# Patient Record
Sex: Female | Born: 1935 | Race: White | Hispanic: No | Marital: Single | Smoking: Never smoker
Health system: Southern US, Community
[De-identification: ages and names within clinical notes are randomized; demographics above are authoritative.]

## PROBLEM LIST (undated history)

## (undated) DIAGNOSIS — G309 Alzheimer's disease, unspecified: Secondary | ICD-10-CM

## (undated) DIAGNOSIS — E119 Type 2 diabetes mellitus without complications: Secondary | ICD-10-CM

## (undated) DIAGNOSIS — I10 Essential (primary) hypertension: Secondary | ICD-10-CM

## (undated) DIAGNOSIS — E039 Hypothyroidism, unspecified: Secondary | ICD-10-CM

## (undated) DIAGNOSIS — I4891 Unspecified atrial fibrillation: Secondary | ICD-10-CM

## (undated) DIAGNOSIS — F0280 Dementia in other diseases classified elsewhere without behavioral disturbance: Secondary | ICD-10-CM

## (undated) HISTORY — PX: ABDOMINAL HYSTERECTOMY: SHX81

---

## 2008-07-31 ENCOUNTER — Encounter: Admission: RE | Admit: 2008-07-31 | Discharge: 2008-07-31 | Payer: Self-pay | Admitting: Geriatric Medicine

## 2009-12-23 ENCOUNTER — Encounter: Admission: RE | Admit: 2009-12-23 | Discharge: 2009-12-23 | Payer: Self-pay | Admitting: Geriatric Medicine

## 2010-05-27 ENCOUNTER — Encounter
Admission: RE | Admit: 2010-05-27 | Discharge: 2010-05-27 | Payer: Self-pay | Source: Home / Self Care | Attending: Geriatric Medicine | Admitting: Geriatric Medicine

## 2010-08-25 ENCOUNTER — Other Ambulatory Visit: Payer: Self-pay | Admitting: Geriatric Medicine

## 2010-08-25 DIAGNOSIS — R748 Abnormal levels of other serum enzymes: Secondary | ICD-10-CM

## 2010-08-27 ENCOUNTER — Other Ambulatory Visit: Payer: Self-pay

## 2010-08-28 ENCOUNTER — Ambulatory Visit
Admission: RE | Admit: 2010-08-28 | Discharge: 2010-08-28 | Disposition: A | Discharge status: Home / Self Care | Payer: Medicare Other | Source: Ambulatory Visit | Attending: Geriatric Medicine | Admitting: Geriatric Medicine

## 2010-08-28 DIAGNOSIS — R748 Abnormal levels of other serum enzymes: Secondary | ICD-10-CM

## 2011-06-02 ENCOUNTER — Other Ambulatory Visit: Payer: Self-pay | Admitting: Geriatric Medicine

## 2011-06-02 DIAGNOSIS — R109 Unspecified abdominal pain: Secondary | ICD-10-CM

## 2011-06-04 ENCOUNTER — Ambulatory Visit
Admission: RE | Admit: 2011-06-04 | Discharge: 2011-06-04 | Disposition: A | Discharge status: Home / Self Care | Payer: Medicare Other | Source: Ambulatory Visit | Attending: Geriatric Medicine | Admitting: Geriatric Medicine

## 2011-06-04 DIAGNOSIS — R109 Unspecified abdominal pain: Secondary | ICD-10-CM

## 2011-06-04 MED ORDER — IOHEXOL 300 MG/ML  SOLN
100.0000 mL | Freq: Once | INTRAMUSCULAR | Status: AC | PRN
  Administered 2011-06-04: 100 mL via INTRAVENOUS

## 2012-11-02 ENCOUNTER — Other Ambulatory Visit: Payer: Self-pay | Admitting: Geriatric Medicine

## 2012-11-02 DIAGNOSIS — R2681 Unsteadiness on feet: Secondary | ICD-10-CM

## 2012-11-08 ENCOUNTER — Ambulatory Visit
Admission: RE | Admit: 2012-11-08 | Discharge: 2012-11-08 | Disposition: A | Discharge status: Home / Self Care | Payer: Medicare Other | Source: Ambulatory Visit | Attending: Geriatric Medicine | Admitting: Geriatric Medicine

## 2012-11-08 DIAGNOSIS — R2681 Unsteadiness on feet: Secondary | ICD-10-CM

## 2014-11-25 DIAGNOSIS — I4891 Unspecified atrial fibrillation: Secondary | ICD-10-CM | POA: Insufficient documentation

## 2014-11-27 ENCOUNTER — Other Ambulatory Visit: Payer: Self-pay

## 2014-11-27 ENCOUNTER — Ambulatory Visit (INDEPENDENT_AMBULATORY_CARE_PROVIDER_SITE_OTHER): Payer: Medicare Other

## 2014-11-27 ENCOUNTER — Other Ambulatory Visit (HOSPITAL_COMMUNITY): Payer: Self-pay | Admitting: Geriatric Medicine

## 2014-11-27 ENCOUNTER — Ambulatory Visit (HOSPITAL_COMMUNITY): Payer: Medicare Other

## 2014-11-27 ENCOUNTER — Ambulatory Visit (HOSPITAL_COMMUNITY): Payer: Medicare Other | Attending: Cardiology

## 2014-11-27 DIAGNOSIS — E785 Hyperlipidemia, unspecified: Secondary | ICD-10-CM | POA: Insufficient documentation

## 2014-11-27 DIAGNOSIS — E119 Type 2 diabetes mellitus without complications: Secondary | ICD-10-CM | POA: Diagnosis not present

## 2014-11-27 DIAGNOSIS — I4891 Unspecified atrial fibrillation: Secondary | ICD-10-CM | POA: Diagnosis not present

## 2014-11-27 DIAGNOSIS — I700 Atherosclerosis of aorta: Secondary | ICD-10-CM | POA: Insufficient documentation

## 2014-11-27 DIAGNOSIS — I5189 Other ill-defined heart diseases: Secondary | ICD-10-CM | POA: Insufficient documentation

## 2014-11-27 DIAGNOSIS — I10 Essential (primary) hypertension: Secondary | ICD-10-CM | POA: Diagnosis not present

## 2014-11-27 DIAGNOSIS — I340 Nonrheumatic mitral (valve) insufficiency: Secondary | ICD-10-CM | POA: Diagnosis not present

## 2016-03-05 ENCOUNTER — Inpatient Hospital Stay (HOSPITAL_COMMUNITY)
Admission: EM | Admit: 2016-03-05 | Discharge: 2016-03-12 | DRG: 392 | Disposition: A | Discharge status: Skilled Nursing Facility | Payer: Medicare Other | Attending: Internal Medicine | Admitting: Internal Medicine

## 2016-03-05 ENCOUNTER — Encounter (HOSPITAL_COMMUNITY): Payer: Self-pay

## 2016-03-05 DIAGNOSIS — E876 Hypokalemia: Secondary | ICD-10-CM | POA: Diagnosis not present

## 2016-03-05 DIAGNOSIS — D72829 Elevated white blood cell count, unspecified: Secondary | ICD-10-CM | POA: Diagnosis present

## 2016-03-05 DIAGNOSIS — K5792 Diverticulitis of intestine, part unspecified, without perforation or abscess without bleeding: Secondary | ICD-10-CM | POA: Diagnosis present

## 2016-03-05 DIAGNOSIS — I4891 Unspecified atrial fibrillation: Secondary | ICD-10-CM | POA: Diagnosis present

## 2016-03-05 DIAGNOSIS — E1159 Type 2 diabetes mellitus with other circulatory complications: Secondary | ICD-10-CM | POA: Diagnosis present

## 2016-03-05 DIAGNOSIS — H9190 Unspecified hearing loss, unspecified ear: Secondary | ICD-10-CM | POA: Diagnosis present

## 2016-03-05 DIAGNOSIS — Z7901 Long term (current) use of anticoagulants: Secondary | ICD-10-CM

## 2016-03-05 DIAGNOSIS — E872 Acidosis: Secondary | ICD-10-CM | POA: Diagnosis present

## 2016-03-05 DIAGNOSIS — Z888 Allergy status to other drugs, medicaments and biological substances status: Secondary | ICD-10-CM

## 2016-03-05 DIAGNOSIS — I480 Paroxysmal atrial fibrillation: Secondary | ICD-10-CM | POA: Diagnosis present

## 2016-03-05 DIAGNOSIS — E039 Hypothyroidism, unspecified: Secondary | ICD-10-CM | POA: Diagnosis present

## 2016-03-05 DIAGNOSIS — Z8249 Family history of ischemic heart disease and other diseases of the circulatory system: Secondary | ICD-10-CM

## 2016-03-05 DIAGNOSIS — Z7984 Long term (current) use of oral hypoglycemic drugs: Secondary | ICD-10-CM

## 2016-03-05 DIAGNOSIS — Z90710 Acquired absence of both cervix and uterus: Secondary | ICD-10-CM

## 2016-03-05 DIAGNOSIS — K5700 Diverticulitis of small intestine with perforation and abscess without bleeding: Principal | ICD-10-CM | POA: Diagnosis present

## 2016-03-05 DIAGNOSIS — I10 Essential (primary) hypertension: Secondary | ICD-10-CM | POA: Diagnosis present

## 2016-03-05 DIAGNOSIS — I152 Hypertension secondary to endocrine disorders: Secondary | ICD-10-CM | POA: Diagnosis present

## 2016-03-05 DIAGNOSIS — Z7722 Contact with and (suspected) exposure to environmental tobacco smoke (acute) (chronic): Secondary | ICD-10-CM | POA: Diagnosis present

## 2016-03-05 DIAGNOSIS — E119 Type 2 diabetes mellitus without complications: Secondary | ICD-10-CM | POA: Diagnosis present

## 2016-03-05 DIAGNOSIS — G309 Alzheimer's disease, unspecified: Secondary | ICD-10-CM | POA: Diagnosis present

## 2016-03-05 DIAGNOSIS — K5720 Diverticulitis of large intestine with perforation and abscess without bleeding: Secondary | ICD-10-CM

## 2016-03-05 DIAGNOSIS — F0280 Dementia in other diseases classified elsewhere without behavioral disturbance: Secondary | ICD-10-CM | POA: Diagnosis present

## 2016-03-05 DIAGNOSIS — R509 Fever, unspecified: Secondary | ICD-10-CM | POA: Diagnosis not present

## 2016-03-05 DIAGNOSIS — Z79899 Other long term (current) drug therapy: Secondary | ICD-10-CM

## 2016-03-05 DIAGNOSIS — M6281 Muscle weakness (generalized): Secondary | ICD-10-CM

## 2016-03-05 HISTORY — DX: Dementia in other diseases classified elsewhere, unspecified severity, without behavioral disturbance, psychotic disturbance, mood disturbance, and anxiety: F02.80

## 2016-03-05 HISTORY — DX: Unspecified atrial fibrillation: I48.91

## 2016-03-05 HISTORY — DX: Essential (primary) hypertension: I10

## 2016-03-05 HISTORY — DX: Alzheimer's disease, unspecified: G30.9

## 2016-03-05 HISTORY — DX: Hypothyroidism, unspecified: E03.9

## 2016-03-05 HISTORY — DX: Type 2 diabetes mellitus without complications: E11.9

## 2016-03-05 LAB — COMPREHENSIVE METABOLIC PANEL
ALK PHOS: 38 U/L (ref 38–126)
ALT: 25 U/L (ref 14–54)
AST: 30 U/L (ref 15–41)
Albumin: 3.4 g/dL — ABNORMAL LOW (ref 3.5–5.0)
Anion gap: 11 (ref 5–15)
BILIRUBIN TOTAL: 0.6 mg/dL (ref 0.3–1.2)
BUN: 21 mg/dL — ABNORMAL HIGH (ref 6–20)
CALCIUM: 9.4 mg/dL (ref 8.9–10.3)
CO2: 20 mmol/L — AB (ref 22–32)
CREATININE: 1.1 mg/dL — AB (ref 0.44–1.00)
Chloride: 105 mmol/L (ref 101–111)
GFR, EST AFRICAN AMERICAN: 52 mL/min — AB (ref >60)
GFR, EST NON AFRICAN AMERICAN: 45 mL/min — AB (ref >60)
Glucose, Bld: 219 mg/dL — ABNORMAL HIGH (ref 65–99)
Potassium: 4.2 mmol/L (ref 3.5–5.1)
Sodium: 136 mmol/L (ref 135–145)
TOTAL PROTEIN: 7.9 g/dL (ref 6.5–8.1)

## 2016-03-05 LAB — CBC
HCT: 33.8 % — ABNORMAL LOW (ref 36.0–46.0)
Hemoglobin: 11.2 g/dL — ABNORMAL LOW (ref 12.0–15.0)
MCH: 30.9 pg (ref 26.0–34.0)
MCHC: 33.1 g/dL (ref 30.0–36.0)
MCV: 93.1 fL (ref 78.0–100.0)
PLATELETS: 227 10*3/uL (ref 150–400)
RBC: 3.63 MIL/uL — AB (ref 3.87–5.11)
RDW: 14.3 % (ref 11.5–15.5)
WBC: 13.8 10*3/uL — ABNORMAL HIGH (ref 4.0–10.5)

## 2016-03-05 LAB — PROTIME-INR
INR: 1.2
PROTHROMBIN TIME: 15.3 s — AB (ref 11.4–15.2)

## 2016-03-05 MED ORDER — ACETAMINOPHEN 500 MG PO TABS
1000.0000 mg | ORAL_TABLET | Freq: Once | ORAL | Status: AC
Start: 2016-03-05 — End: 2016-03-05
  Administered 2016-03-05: 1000 mg via ORAL
  Filled 2016-03-05: qty 2

## 2016-03-05 MED ORDER — SODIUM CHLORIDE 0.9 % IV BOLUS (SEPSIS)
1000.0000 mL | Freq: Once | INTRAVENOUS | Status: AC
  Administered 2016-03-06: 1000 mL via INTRAVENOUS

## 2016-03-05 NOTE — ED Triage Notes (Signed)
Pt sent in for evaluation of fever from NH pt has no fever upon arrival to hospital

## 2016-03-05 NOTE — ED Provider Notes (Signed)
[facility]-EMERGENCY DEPT Provider Note   CSN: [id number] Arrival date & time: 03/05/16  2139  By signing my name below, I, Heidi George, attest that this documentation has been prepared under the direction and in the presence of Heidi CrumbleAdeleke Marvine Encalade, MD. Electronically Signed: Rosario AdieWilliam Andrew George, ED Scribe. 03/05/16. 11:17 PM.  History   Chief Complaint Chief Complaint  Patient presents with  . Fever    pt sent in for fever eval from [facility]    LEVEL 5 CAVEAT: HPI and ROS limited due to alzheimer's disease  The history is provided by the patient, medical records, the nursing home and a relative. History limited by: Alzheimer's disease. No language interpreter was used.   HPI Comments: Heidi George is a 82 y.o. female BIB EMS from [facility], with a PMHx of Alzheimer's disease, who presents to the Emergency Department accompanied by family who are complaining of fever onset earlier today. Family reports that the facility called them today and informed them that approximately 4 hours PTA that she was acting mildly confused from her baseline and they noted that she had a fever. Family states that the pt is also more confused from her typical baseline. Per family, pt was acting towards her baseline prior to seeing her today. Family also notes that the pt did sustain a fall approximately one week ago where she fell onto her backside. Pt states that the left side of her back is still in sore-like pain due to this. Per family, facility did not give the pt any treatments prior to the pt being brought into the ED. Pt is on Eliquis therapy daily. Family denies any cough, rhinorrhea, diarrhea, or any other known symptoms.   Past Medical History:  Diagnosis Date  . Alzheimer's dementia   . Atrial fibrillation (HCC)   . Diabetes mellitus without complication (HCC)   . Hypertension   . Hypothyroidism    Patient Active Problem List   Diagnosis Date Noted  . Diverticulitis 03/06/2016  .  Atrial fibrillation with controlled ventricular response (HCC) 11/25/2014   History reviewed. No pertinent surgical history.  OB History    No data available     Home Medications    Prior to Admission medications   Medication Sig Start Date End Date Taking? Authorizing Provider  apixaban (ELIQUIS) 2.5 MG TABS tablet Take 2.5 mg by mouth 2 (two) times daily.   Yes Historical Provider, MD  cloNIDine (CATAPRES) 0.1 MG tablet Take 0.1 mg by mouth 2 (two) times daily.   Yes Historical Provider, MD  donepezil (ARICEPT) 10 MG tablet Take 10 mg by mouth at bedtime.   Yes Historical Provider, MD  ferrous sulfate 325 (65 FE) MG tablet Take 325 mg by mouth daily with breakfast.   Yes Historical Provider, MD  levothyroxine (SYNTHROID, LEVOTHROID) 150 MCG tablet Take 150 mcg by mouth daily before breakfast.   Yes Historical Provider, MD  losartan (COZAAR) 100 MG tablet Take 100 mg by mouth daily.   Yes Historical Provider, MD  memantine (NAMENDA XR) 28 MG CP24 24 hr capsule Take 28 mg by mouth daily.   Yes Historical Provider, MD  metFORMIN (GLUCOPHAGE-XR) 500 MG 24 hr tablet Take 500 mg by mouth daily with breakfast.   Yes Historical Provider, MD  OVER THE COUNTER MEDICATION Take 2 tablets by mouth daily. Vitafusion Calcium gummy   Yes Historical Provider, MD  spironolactone (ALDACTONE) 25 MG tablet Take 25 mg by mouth 2 (two) times daily.   Yes Historical  Provider, MD   Family History No family history on file.  Social History Social History  Substance Use Topics  . Smoking status: Unknown If Ever Smoked  . Smokeless tobacco: Never Used  . Alcohol use No   Allergies   Promethazine  Review of Systems Review of Systems  Unable to perform ROS: Dementia   Physical Exam Updated Vital Signs BP 152/99   Pulse 94   Temp 99.2 F (37.3 C) (Rectal)   Resp 23   Ht 5\' 6"  (1.676 m)   Wt 150 lb (68 kg)   SpO2 97%   BMI 24.21 kg/m   Physical Exam  Constitutional: She appears well-developed  and well-nourished. No distress.  HENT:  Head: Normocephalic and atraumatic.  Nose: Nose normal.  Mouth/Throat: Oropharynx is clear and moist. No oropharyngeal exudate.  Eyes: Conjunctivae and EOM are normal. Pupils are equal, round, and reactive to light. No scleral icterus.  Neck: Normal range of motion. Neck supple. No JVD present. No tracheal deviation present. No thyromegaly present.  Cardiovascular: Normal rate, regular rhythm and normal heart sounds.  Exam reveals no gallop and no friction rub.   No murmur heard. Pulmonary/Chest: Effort normal and breath sounds normal. No respiratory distress. She has no wheezes. She exhibits no tenderness.  Abdominal: Soft. Bowel sounds are normal. She exhibits no distension and no mass. There is no tenderness. There is no rebound and no guarding.  Musculoskeletal: Normal range of motion. She exhibits no edema or tenderness.  Lymphadenopathy:    She has no cervical adenopathy.  Neurological:  Pt follows all commands.   Skin: Skin is warm and dry. No rash noted. No erythema. No pallor.  Tactile fever noted.   Nursing note and vitals reviewed.  ED Treatments / Results  DIAGNOSTIC STUDIES: Oxygen Saturation is 95% on RA, adequate by my interpretation.   COORDINATION OF CARE: 11:12 PM-Discussed next steps with family. Family verbalized understanding and is agreeable with the plan.   Labs (all labs ordered are listed, but only abnormal results are displayed) Labs Reviewed  COMPREHENSIVE METABOLIC PANEL - Abnormal; Notable for the following:       Result Value   CO2 20 (*)    Glucose, Bld 219 (*)    BUN 21 (*)    Creatinine, Ser 1.10 (*)    Albumin 3.4 (*)    GFR calc non Af Amer 45 (*)    GFR calc Af Amer 52 (*)    All other components within normal limits  CBC - Abnormal; Notable for the following:    WBC 13.8 (*)    RBC 3.63 (*)    Hemoglobin 11.2 (*)    HCT 33.8 (*)    All other components within normal limits  PROTIME-INR -  Abnormal; Notable for the following:    Prothrombin Time 15.3 (*)    All other components within normal limits  URINALYSIS, ROUTINE W REFLEX MICROSCOPIC (NOT AT [facility]) - Abnormal; Notable for the following:    Protein, ur 30 (*)    All other components within normal limits  URINE MICROSCOPIC-ADD ON - Abnormal; Notable for the following:    Bacteria, UA RARE (*)    All other components within normal limits  URINE CULTURE    EKG  EKG Interpretation None       Radiology Dg Chest 2 View  Result Date: 03/06/2016 CLINICAL DATA:  Fever for 1 day. EXAM: CHEST  2 VIEW COMPARISON:  None. FINDINGS: No airspace consolidation. No effusions.  Mild interstitial prominence may represent interstitial infiltrate or interstitial fluid. Mild cardiomegaly. IMPRESSION: Mild interstitial fluid or thickening. This could represent a degree of congestive heart failure. No airspace consolidation or effusion Electronically Signed   By: Ellery Plunkaniel R Mitchell M.D.   On: 03/06/2016 00:52   Ct Abdomen Pelvis W Contrast  Result Date: 03/06/2016 CLINICAL DATA:  Acute onset of fever.  Initial encounter. EXAM: CT ABDOMEN AND PELVIS WITH CONTRAST TECHNIQUE: Multidetector CT imaging of the abdomen and pelvis was performed using the standard protocol following bolus administration of intravenous contrast. CONTRAST:  100mL ISOVUE-300 IOPAMIDOL (ISOVUE-300) INJECTION 61% COMPARISON:  CT of the abdomen and pelvis from 06/04/2011 FINDINGS: Lower chest: Mild bibasilar peripheral opacities may reflect mild fibrotic change and atelectasis. Hepatobiliary: The liver is unremarkable in appearance. The patient is status post cholecystectomy, with clips noted at the gallbladder fossa. The common bile duct remains normal in caliber. Pancreas: The pancreas is within normal limits. Spleen: The spleen is unremarkable in appearance. Adrenals/Urinary Tract: The adrenal glands are unremarkable in appearance. The kidneys are within normal limits. There  is no evidence of hydronephrosis. No renal or ureteral stones are identified. Nonspecific perinephric stranding is noted bilaterally. Stomach/Bowel: The stomach is unremarkable in appearance. Scattered diverticulosis is noted along the proximal to mid ileum at the left side of the abdomen, with focal mesenteric inflammation tracking proximally, and associated small focus of fluid and air adjacent to the bowel measuring 1.8 cm, raising concern for mild contained perforation extending into the mesentery. A prominent duodenal diverticulum is noted at the pancreatic head. The appendix is not visualized; there is no evidence for appendicitis. Mild diverticulosis is noted along the sigmoid colon. Vascular/Lymphatic: Scattered calcification is seen along the abdominal aorta and its branches. The abdominal aorta is otherwise grossly unremarkable. The inferior vena cava is grossly unremarkable. No retroperitoneal lymphadenopathy is seen. No pelvic sidewall lymphadenopathy is identified. Reproductive: The bladder is mildly distended and grossly unremarkable. The patient is status post hysterectomy. No suspicious adnexal masses are seen. Other: No additional soft tissue abnormalities are seen. Musculoskeletal: No acute osseous abnormalities are identified. Vacuum phenomenon is noted at L5-S1. The visualized musculature is unremarkable in appearance. IMPRESSION: 1. Acute diverticulitis at the proximal to mid ileum at the left side of the abdomen, with focal mesenteric inflammation tracking proximally. Small focus of fluid and air adjacent to the bowel measures 1.8 cm, concerning for mild contained perforation extending into the mesentery. 2. Scattered diverticulosis along the proximal to mid ileum. Prominent duodenal diverticulum also noted at the pancreatic head. Mild diverticulosis along the sigmoid colon. 3. Mild bibasilar peripheral opacities may reflect mild fibrotic change and atelectasis. 4. Scattered aortic  atherosclerosis. These results were called by telephone at the time of interpretation on 03/06/2016 at 2:47 am to Dr. Tomasita CrumbleADELEKE Rafaella Kole, who verbally acknowledged these results. Electronically Signed   By: Roanna RaiderJeffery  Chang M.D.   On: 03/06/2016 02:49    Procedures Procedures (including critical care time)  Medications Ordered in ED Medications  ciprofloxacin (CIPRO) tablet 500 mg (not administered)  metroNIDAZOLE (FLAGYL) tablet 500 mg (not administered)  acetaminophen (TYLENOL) tablet 1,000 mg (1,000 mg Oral Given 03/05/16 2332)  sodium chloride 0.9 % bolus 1,000 mL (1,000 mLs Intravenous New Bag/Given 03/06/16 0009)  iopamidol (ISOVUE-300) 61 % injection (100 mLs  Contrast Given 03/06/16 0147)     Initial Impression / Assessment and Plan / ED Course  I have reviewed the triage vital signs and the nursing notes.  Pertinent labs & imaging  results that were available during my care of the patient were reviewed by me and considered in my medical decision making (see chart for details).  Clinical Course    Patient presents to the ED for fever, weakness, and worsening mentation.  Will check rectal temperature here as she feels quite warm.  Plan to obtain CXR and UA for further evaluation.  Labs are pending.   3:42 AM Labs unremarkable.  CXR neg, UA neg for infection.  Will obtain CT of the abdomen to ensure there is no acute infectious process going on.  Will also obtain repeat VS at this time.  3:42 AM I spoke with general surgery who will consult on the patient.  CT reveals diverticulitis with perf.  She was given cipro and flagyl for treatment.  Hospitalist will admit.  Final Clinical Impressions(s) / ED Diagnoses   Final diagnoses:  Diverticulitis of large intestine with perforation, unspecified bleeding status    New Prescriptions New Prescriptions   No medications on file    I personally performed the services described in this documentation, which was scribed in my presence. The  recorded information has been reviewed and is accurate.       Heidi Crumble, MD 03/06/16 [id number]

## 2016-03-06 ENCOUNTER — Emergency Department (HOSPITAL_COMMUNITY): Payer: Medicare Other

## 2016-03-06 DIAGNOSIS — E872 Acidosis: Secondary | ICD-10-CM | POA: Diagnosis present

## 2016-03-06 DIAGNOSIS — K5700 Diverticulitis of small intestine with perforation and abscess without bleeding: Principal | ICD-10-CM

## 2016-03-06 DIAGNOSIS — Z79899 Other long term (current) drug therapy: Secondary | ICD-10-CM | POA: Diagnosis not present

## 2016-03-06 DIAGNOSIS — Z8249 Family history of ischemic heart disease and other diseases of the circulatory system: Secondary | ICD-10-CM | POA: Diagnosis not present

## 2016-03-06 DIAGNOSIS — I480 Paroxysmal atrial fibrillation: Secondary | ICD-10-CM | POA: Diagnosis present

## 2016-03-06 DIAGNOSIS — E876 Hypokalemia: Secondary | ICD-10-CM | POA: Diagnosis not present

## 2016-03-06 DIAGNOSIS — K5720 Diverticulitis of large intestine with perforation and abscess without bleeding: Secondary | ICD-10-CM | POA: Diagnosis not present

## 2016-03-06 DIAGNOSIS — Z7901 Long term (current) use of anticoagulants: Secondary | ICD-10-CM | POA: Diagnosis not present

## 2016-03-06 DIAGNOSIS — I4891 Unspecified atrial fibrillation: Secondary | ICD-10-CM | POA: Diagnosis not present

## 2016-03-06 DIAGNOSIS — E119 Type 2 diabetes mellitus without complications: Secondary | ICD-10-CM

## 2016-03-06 DIAGNOSIS — D72829 Elevated white blood cell count, unspecified: Secondary | ICD-10-CM | POA: Diagnosis present

## 2016-03-06 DIAGNOSIS — H9190 Unspecified hearing loss, unspecified ear: Secondary | ICD-10-CM | POA: Diagnosis present

## 2016-03-06 DIAGNOSIS — E039 Hypothyroidism, unspecified: Secondary | ICD-10-CM | POA: Diagnosis present

## 2016-03-06 DIAGNOSIS — Z7984 Long term (current) use of oral hypoglycemic drugs: Secondary | ICD-10-CM | POA: Diagnosis not present

## 2016-03-06 DIAGNOSIS — Z888 Allergy status to other drugs, medicaments and biological substances status: Secondary | ICD-10-CM | POA: Diagnosis not present

## 2016-03-06 DIAGNOSIS — F0280 Dementia in other diseases classified elsewhere without behavioral disturbance: Secondary | ICD-10-CM

## 2016-03-06 DIAGNOSIS — I152 Hypertension secondary to endocrine disorders: Secondary | ICD-10-CM | POA: Diagnosis present

## 2016-03-06 DIAGNOSIS — Z90710 Acquired absence of both cervix and uterus: Secondary | ICD-10-CM | POA: Diagnosis not present

## 2016-03-06 DIAGNOSIS — Z7722 Contact with and (suspected) exposure to environmental tobacco smoke (acute) (chronic): Secondary | ICD-10-CM | POA: Diagnosis present

## 2016-03-06 DIAGNOSIS — R509 Fever, unspecified: Secondary | ICD-10-CM | POA: Diagnosis present

## 2016-03-06 DIAGNOSIS — K5792 Diverticulitis of intestine, part unspecified, without perforation or abscess without bleeding: Secondary | ICD-10-CM | POA: Diagnosis present

## 2016-03-06 DIAGNOSIS — E1159 Type 2 diabetes mellitus with other circulatory complications: Secondary | ICD-10-CM | POA: Diagnosis present

## 2016-03-06 DIAGNOSIS — G309 Alzheimer's disease, unspecified: Secondary | ICD-10-CM | POA: Diagnosis present

## 2016-03-06 DIAGNOSIS — I10 Essential (primary) hypertension: Secondary | ICD-10-CM

## 2016-03-06 LAB — GLUCOSE, CAPILLARY
GLUCOSE-CAPILLARY: 141 mg/dL — AB (ref 65–99)
GLUCOSE-CAPILLARY: 144 mg/dL — AB (ref 65–99)
GLUCOSE-CAPILLARY: 133 mg/dL — AB (ref 65–99)
GLUCOSE-CAPILLARY: 118 mg/dL — AB (ref 65–99)
Glucose-Capillary: 167 mg/dL — ABNORMAL HIGH (ref 65–99)

## 2016-03-06 LAB — CBC WITH DIFFERENTIAL/PLATELET
BASOS ABS: 0 10*3/uL (ref 0.0–0.1)
Basophils Relative: 0 %
EOS PCT: 2 %
Eosinophils Absolute: 0.2 10*3/uL (ref 0.0–0.7)
HEMATOCRIT: 32.1 % — AB (ref 36.0–46.0)
Hemoglobin: 10.5 g/dL — ABNORMAL LOW (ref 12.0–15.0)
Lymphocytes Relative: 18 %
Lymphs Abs: 2.1 10*3/uL (ref 0.7–4.0)
MCH: 30.6 pg (ref 26.0–34.0)
MCHC: 32.7 g/dL (ref 30.0–36.0)
MCV: 93.6 fL (ref 78.0–100.0)
MONO ABS: 0.6 10*3/uL (ref 0.1–1.0)
MONOS PCT: 6 %
NEUTROS ABS: 8.3 10*3/uL — AB (ref 1.7–7.7)
Neutrophils Relative %: 74 %
PLATELETS: 199 10*3/uL (ref 150–400)
RBC: 3.43 MIL/uL — ABNORMAL LOW (ref 3.87–5.11)
RDW: 14.4 % (ref 11.5–15.5)
WBC: 11.3 10*3/uL — ABNORMAL HIGH (ref 4.0–10.5)

## 2016-03-06 LAB — COMPREHENSIVE METABOLIC PANEL
ALT: 20 U/L (ref 14–54)
ANION GAP: 6 (ref 5–15)
AST: 19 U/L (ref 15–41)
Albumin: 3.1 g/dL — ABNORMAL LOW (ref 3.5–5.0)
Alkaline Phosphatase: 31 U/L — ABNORMAL LOW (ref 38–126)
BILIRUBIN TOTAL: 0.6 mg/dL (ref 0.3–1.2)
BUN: 15 mg/dL (ref 6–20)
CHLORIDE: 110 mmol/L (ref 101–111)
CO2: 23 mmol/L (ref 22–32)
Calcium: 8.8 mg/dL — ABNORMAL LOW (ref 8.9–10.3)
Creatinine, Ser: 0.95 mg/dL (ref 0.44–1.00)
GFR calc Af Amer: >60 mL/min (ref >60)
GFR, EST NON AFRICAN AMERICAN: 54 mL/min — AB (ref >60)
Glucose, Bld: 142 mg/dL — ABNORMAL HIGH (ref 65–99)
POTASSIUM: 3.7 mmol/L (ref 3.5–5.1)
Sodium: 139 mmol/L (ref 135–145)
TOTAL PROTEIN: 7.1 g/dL (ref 6.5–8.1)

## 2016-03-06 LAB — URINALYSIS, ROUTINE W REFLEX MICROSCOPIC
BILIRUBIN URINE: NEGATIVE
GLUCOSE, UA: NEGATIVE mg/dL
Hgb urine dipstick: NEGATIVE
KETONES UR: NEGATIVE mg/dL
Leukocytes, UA: NEGATIVE
NITRITE: NEGATIVE
PH: 5.5 (ref 5.0–8.0)
PROTEIN: 30 mg/dL — AB
Specific Gravity, Urine: 1.013 (ref 1.005–1.030)

## 2016-03-06 LAB — MRSA PCR SCREENING: MRSA BY PCR: NEGATIVE

## 2016-03-06 LAB — URINE MICROSCOPIC-ADD ON: Squamous Epithelial / LPF: NONE SEEN

## 2016-03-06 LAB — LACTIC ACID, PLASMA
LACTIC ACID, VENOUS: 1.6 mmol/L (ref 0.5–1.9)
Lactic Acid, Venous: 2.1 mmol/L (ref 0.5–1.9)

## 2016-03-06 MED ORDER — MEMANTINE HCL ER 28 MG PO CP24
28.0000 mg | ORAL_CAPSULE | Freq: Every day | ORAL | Status: DC
  Administered 2016-03-06 – 2016-03-12 (×7): 28 mg via ORAL
  Filled 2016-03-06 (×7): qty 1

## 2016-03-06 MED ORDER — CIPROFLOXACIN IN D5W 400 MG/200ML IV SOLN: 400.0000 mg | Freq: Once | INTRAVENOUS | Status: DC

## 2016-03-06 MED ORDER — SODIUM CHLORIDE 0.9 % IV SOLN
INTRAVENOUS | Status: DC
  Administered 2016-03-06: 23:00:00 via INTRAVENOUS

## 2016-03-06 MED ORDER — ONDANSETRON HCL 4 MG/2ML IJ SOLN: 4.0000 mg | Freq: Four times a day (QID) | INTRAMUSCULAR | Status: DC | PRN

## 2016-03-06 MED ORDER — METRONIDAZOLE IN NACL 5-0.79 MG/ML-% IV SOLN: 500.0000 mg | Freq: Once | INTRAVENOUS | Status: DC

## 2016-03-06 MED ORDER — CIPROFLOXACIN IN D5W 400 MG/200ML IV SOLN
400.0000 mg | Freq: Two times a day (BID) | INTRAVENOUS | Status: DC
  Administered 2016-03-06 – 2016-03-10 (×9): 400 mg via INTRAVENOUS
  Filled 2016-03-06 (×10): qty 200

## 2016-03-06 MED ORDER — LEVOTHYROXINE SODIUM 75 MCG PO TABS
150.0000 ug | ORAL_TABLET | Freq: Every day | ORAL | Status: DC
  Administered 2016-03-06 – 2016-03-12 (×7): 150 ug via ORAL
  Filled 2016-03-06 (×7): qty 2

## 2016-03-06 MED ORDER — ONDANSETRON HCL 4 MG PO TABS: 4.0000 mg | ORAL_TABLET | Freq: Four times a day (QID) | ORAL | Status: DC | PRN

## 2016-03-06 MED ORDER — SODIUM CHLORIDE 0.9 % IV SOLN
INTRAVENOUS | Status: AC
  Administered 2016-03-06: 05:00:00 via INTRAVENOUS

## 2016-03-06 MED ORDER — IOPAMIDOL (ISOVUE-300) INJECTION 61%
INTRAVENOUS | Status: AC
  Administered 2016-03-06: 100 mL
  Filled 2016-03-06: qty 100

## 2016-03-06 MED ORDER — SPIRONOLACTONE 25 MG PO TABS
25.0000 mg | ORAL_TABLET | Freq: Two times a day (BID) | ORAL | Status: DC
  Administered 2016-03-06 – 2016-03-12 (×13): 25 mg via ORAL
  Filled 2016-03-06 (×13): qty 1

## 2016-03-06 MED ORDER — METRONIDAZOLE IN NACL 5-0.79 MG/ML-% IV SOLN
500.0000 mg | Freq: Three times a day (TID) | INTRAVENOUS | Status: DC
  Administered 2016-03-06 – 2016-03-10 (×13): 500 mg via INTRAVENOUS
  Filled 2016-03-06 (×15): qty 100

## 2016-03-06 MED ORDER — METRONIDAZOLE 500 MG PO TABS
500.0000 mg | ORAL_TABLET | Freq: Once | ORAL | Status: DC
  Filled 2016-03-06: qty 1

## 2016-03-06 MED ORDER — INSULIN ASPART 100 UNIT/ML ~~LOC~~ SOLN
0.0000 [IU] | SUBCUTANEOUS | Status: DC
  Administered 2016-03-06: 3 [IU] via SUBCUTANEOUS
  Administered 2016-03-06 – 2016-03-09 (×10): 2 [IU] via SUBCUTANEOUS
  Administered 2016-03-09 (×2): 3 [IU] via SUBCUTANEOUS

## 2016-03-06 MED ORDER — ACETAMINOPHEN 325 MG PO TABS
650.0000 mg | ORAL_TABLET | Freq: Four times a day (QID) | ORAL | Status: DC | PRN
  Administered 2016-03-06 – 2016-03-08 (×4): 650 mg via ORAL
  Filled 2016-03-06 (×4): qty 2

## 2016-03-06 MED ORDER — CIPROFLOXACIN HCL 500 MG PO TABS
500.0000 mg | ORAL_TABLET | Freq: Once | ORAL | Status: DC
  Filled 2016-03-06: qty 1

## 2016-03-06 MED ORDER — CLONIDINE HCL 0.1 MG PO TABS
0.1000 mg | ORAL_TABLET | Freq: Two times a day (BID) | ORAL | Status: DC
  Administered 2016-03-06 – 2016-03-07 (×3): 0.1 mg via ORAL
  Filled 2016-03-06 (×3): qty 1

## 2016-03-06 MED ORDER — DONEPEZIL HCL 10 MG PO TABS
10.0000 mg | ORAL_TABLET | Freq: Every day | ORAL | Status: DC
  Administered 2016-03-06 – 2016-03-11 (×6): 10 mg via ORAL
  Filled 2016-03-06 (×7): qty 1

## 2016-03-06 MED ORDER — ACETAMINOPHEN 650 MG RE SUPP: 650.0000 mg | Freq: Four times a day (QID) | RECTAL | Status: DC | PRN

## 2016-03-06 MED ORDER — LOSARTAN POTASSIUM 50 MG PO TABS
100.0000 mg | ORAL_TABLET | Freq: Every day | ORAL | Status: DC
  Administered 2016-03-06 – 2016-03-12 (×7): 100 mg via ORAL
  Filled 2016-03-06 (×7): qty 2

## 2016-03-06 NOTE — Consult Note (Signed)
Reason for Consult:contained diverticular perforation Referring Physician: [name] is an 82 y.o. female.  HPI:  Pt is an 82 yo F with dementia, DM, and atrial fibrillation who is at [facility] presented last night to the ED with fever and increased confusion.  She was seen by family yesterday AM and was more at her baseline.  In the ED, she had U/A and CXr which were essentially negative.  CT was performed to look for source and contained perf was seen.  No n/v was documented, and no history of diarrhea or constipation was found.   The patient's main complaint to me is that she is cold.  She does not answer other questions appropriately. She was not able to express to me whether or not she was in pain.    Past Medical History:  Diagnosis Date  . Alzheimer's dementia   . Atrial fibrillation ([place])   . Diabetes mellitus without complication ([place])   . Hypertension   . Hypothyroidism     History reviewed. No pertinent surgical history.  No family history on file.  Social History:  has an unknown smoking status. She has never used smokeless tobacco. She reports that she does not drink alcohol or use drugs.  Allergies:  Allergies  Allergen Reactions  . Promethazine Other (See Comments)    On MAR     Medications:  Prior to Admission:  Prescriptions Prior to Admission  Medication Sig Dispense Refill Last Dose  . apixaban (ELIQUIS) 2.5 MG TABS tablet Take 2.5 mg by mouth 2 (two) times daily.   03/05/2016 at 0800  . cloNIDine (CATAPRES) 0.1 MG tablet Take 0.1 mg by mouth 2 (two) times daily.   03/05/2016 at Unknown time  . donepezil (ARICEPT) 10 MG tablet Take 10 mg by mouth at bedtime.   03/04/2016 at Unknown time  . ferrous sulfate 325 (65 FE) MG tablet Take 325 mg by mouth daily with breakfast.   03/05/2016 at Unknown time  . levothyroxine (SYNTHROID, LEVOTHROID) 150 MCG tablet Take 150 mcg by mouth daily before breakfast.   03/05/2016 at Unknown time  . losartan (COZAAR)  100 MG tablet Take 100 mg by mouth daily.   03/05/2016 at Unknown time  . memantine (NAMENDA XR) 28 MG CP24 24 hr capsule Take 28 mg by mouth daily.   03/05/2016 at Unknown time  . metFORMIN (GLUCOPHAGE-XR) 500 MG 24 hr tablet Take 500 mg by mouth daily with breakfast.   03/05/2016 at Unknown time  . OVER THE COUNTER MEDICATION Take 2 tablets by mouth daily. Vitafusion Calcium gummy     . spironolactone (ALDACTONE) 25 MG tablet Take 25 mg by mouth 2 (two) times daily.   03/05/2016 at Unknown time    Results for orders placed or performed during the hospital encounter of 03/05/16 (from the past 48 hour(s))  Comprehensive metabolic panel     Status: Abnormal   Collection Time: 03/05/16 10:50 PM  Result Value Ref Range   Sodium 136 135 - 145 mmol/L   Potassium 4.2 3.5 - 5.1 mmol/L   Chloride 105 101 - 111 mmol/L   CO2 20 (L) 22 - 32 mmol/L   Glucose, Bld 219 (H) 65 - 99 mg/dL   BUN 21 (H) 6 - 20 mg/dL   Creatinine, Ser 1.10 (H) 0.44 - 1.00 mg/dL   Calcium 9.4 8.9 - 10.3 mg/dL   Total Protein 7.9 6.5 - 8.1 g/dL   Albumin 3.4 (L) 3.5 - 5.0 g/dL  AST 30 15 - 41 U/L   ALT 25 14 - 54 U/L   Alkaline Phosphatase 38 38 - 126 U/L   Total Bilirubin 0.6 0.3 - 1.2 mg/dL   GFR calc non Af Amer 45 (L) >60 mL/min   GFR calc Af Amer 52 (L) >60 mL/min    Comment: (NOTE) The eGFR has been calculated using the CKD EPI equation. This calculation has not been validated in all clinical situations. eGFR's persistently <60 mL/min signify possible Chronic Kidney Disease.    Anion gap 11 5 - 15  CBC     Status: Abnormal   Collection Time: 03/05/16 10:50 PM  Result Value Ref Range   WBC 13.8 (H) 4.0 - 10.5 K/uL   RBC 3.63 (L) 3.87 - 5.11 MIL/uL   Hemoglobin 11.2 (L) 12.0 - 15.0 g/dL   HCT 33.8 (L) 36.0 - 46.0 %   MCV 93.1 78.0 - 100.0 fL   MCH 30.9 26.0 - 34.0 pg   MCHC 33.1 30.0 - 36.0 g/dL   RDW 14.3 11.5 - 15.5 %   Platelets 227 150 - 400 K/uL  Protime-INR - (order if patient is taking Coumadin /  Warfarin)     Status: Abnormal   Collection Time: 03/05/16 10:50 PM  Result Value Ref Range   Prothrombin Time 15.3 (H) 11.4 - 15.2 seconds   INR 1.20   Urinalysis, Routine w reflex microscopic (not at [facility])     Status: Abnormal   Collection Time: 03/06/16 12:05 AM  Result Value Ref Range   Color, Urine YELLOW YELLOW   APPearance CLEAR CLEAR   Specific Gravity, Urine 1.013 1.005 - 1.030   pH 5.5 5.0 - 8.0   Glucose, UA NEGATIVE NEGATIVE mg/dL   Hgb urine dipstick NEGATIVE NEGATIVE   Bilirubin Urine NEGATIVE NEGATIVE   Ketones, ur NEGATIVE NEGATIVE mg/dL   Protein, ur 30 (A) NEGATIVE mg/dL   Nitrite NEGATIVE NEGATIVE   Leukocytes, UA NEGATIVE NEGATIVE  Urine microscopic-add on     Status: Abnormal   Collection Time: 03/06/16 12:05 AM  Result Value Ref Range   Squamous Epithelial / LPF NONE SEEN NONE SEEN   WBC, UA 0-5 0 - 5 WBC/hpf   RBC / HPF 0-5 0 - 5 RBC/hpf   Bacteria, UA RARE (A) NONE SEEN  Glucose, capillary     Status: Abnormal   Collection Time: 03/06/16  4:39 AM  Result Value Ref Range   Glucose-Capillary 167 (H) 65 - 99 mg/dL  Lactic acid, plasma     Status: None   Collection Time: 03/06/16  4:40 AM  Result Value Ref Range   Lactic Acid, Venous 1.6 0.5 - 1.9 mmol/L  MRSA PCR Screening     Status: None   Collection Time: 03/06/16  4:57 AM  Result Value Ref Range   MRSA by PCR NEGATIVE NEGATIVE    Comment:        The GeneXpert MRSA Assay (FDA approved for NASAL specimens only), is one component of a comprehensive MRSA colonization surveillance program. It is not intended to diagnose MRSA infection nor to guide or monitor treatment for MRSA infections.     Dg Chest 2 View  Result Date: 03/06/2016 CLINICAL DATA:  Fever for 1 day. EXAM: CHEST  2 VIEW COMPARISON:  None. FINDINGS: No airspace consolidation. No effusions. Mild interstitial prominence may represent interstitial infiltrate or interstitial fluid. Mild cardiomegaly. IMPRESSION: Mild interstitial  fluid or thickening. This could represent a degree of congestive heart failure. No  airspace consolidation or effusion Electronically Signed   By: Andreas Newport M.D.   On: 03/06/2016 00:52   Ct Abdomen Pelvis W Contrast  Result Date: 03/06/2016 CLINICAL DATA:  Acute onset of fever.  Initial encounter. EXAM: CT ABDOMEN AND PELVIS WITH CONTRAST TECHNIQUE: Multidetector CT imaging of the abdomen and pelvis was performed using the standard protocol following bolus administration of intravenous contrast. CONTRAST:  160m ISOVUE-300 IOPAMIDOL (ISOVUE-300) INJECTION 61% COMPARISON:  CT of the abdomen and pelvis from 06/04/2011 FINDINGS: Lower chest: Mild bibasilar peripheral opacities may reflect mild fibrotic change and atelectasis. Hepatobiliary: The liver is unremarkable in appearance. The patient is status post cholecystectomy, with clips noted at the gallbladder fossa. The common bile duct remains normal in caliber. Pancreas: The pancreas is within normal limits. Spleen: The spleen is unremarkable in appearance. Adrenals/Urinary Tract: The adrenal glands are unremarkable in appearance. The kidneys are within normal limits. There is no evidence of hydronephrosis. No renal or ureteral stones are identified. Nonspecific perinephric stranding is noted bilaterally. Stomach/Bowel: The stomach is unremarkable in appearance. Scattered diverticulosis is noted along the proximal to mid ileum at the left side of the abdomen, with focal mesenteric inflammation tracking proximally, and associated small focus of fluid and air adjacent to the bowel measuring 1.8 cm, raising concern for mild contained perforation extending into the mesentery. A prominent duodenal diverticulum is noted at the pancreatic head. The appendix is not visualized; there is no evidence for appendicitis. Mild diverticulosis is noted along the sigmoid colon. Vascular/Lymphatic: Scattered calcification is seen along the abdominal aorta and its branches.  The abdominal aorta is otherwise grossly unremarkable. The inferior vena cava is grossly unremarkable. No retroperitoneal lymphadenopathy is seen. No pelvic sidewall lymphadenopathy is identified. Reproductive: The bladder is mildly distended and grossly unremarkable. The patient is status post hysterectomy. No suspicious adnexal masses are seen. Other: No additional soft tissue abnormalities are seen. Musculoskeletal: No acute osseous abnormalities are identified. Vacuum phenomenon is noted at L5-S1. The visualized musculature is unremarkable in appearance. IMPRESSION: 1. Acute diverticulitis at the proximal to mid ileum at the left side of the abdomen, with focal mesenteric inflammation tracking proximally. Small focus of fluid and air adjacent to the bowel measures 1.8 cm, concerning for mild contained perforation extending into the mesentery. 2. Scattered diverticulosis along the proximal to mid ileum. Prominent duodenal diverticulum also noted at the pancreatic head. Mild diverticulosis along the sigmoid colon. 3. Mild bibasilar peripheral opacities may reflect mild fibrotic change and atelectasis. 4. Scattered aortic atherosclerosis. These results were called by telephone at the time of interpretation on 03/06/2016 at 2:47 am to Dr. AEverlene Balls who verbally acknowledged these results. Electronically Signed   By: JGarald BaldingM.D.   On: 03/06/2016 02:49    Review of Systems  Unable to perform ROS: Dementia   Blood pressure (!) 172/62, pulse 64, temperature 98.2 F (36.8 C), temperature source Oral, resp. rate 17, height _0  (1.676 m), weight 57 kg (125 lb 10.6 oz), SpO2 96 %. Physical Exam  Constitutional: She appears well-developed and well-nourished. She appears distressed (mild distress).  HENT:  Head: Normocephalic and atraumatic.  Eyes: Conjunctivae are normal. Pupils are equal, round, and reactive to light.  Neck: Neck supple.  Cardiovascular: Normal rate.   Respiratory: Effort normal.  No respiratory distress.  GI: Soft. She exhibits distension (mild). There is tenderness (upper abdominal). There is guarding (voluntary). There is no rebound.  Neurological: She is alert. Coordination normal.  Skin: Skin is warm and  dry. No rash noted. She is not diaphoretic. No erythema. No pallor.  Psychiatric: She has a normal mood and affect.  confused.  Picking at her blanket.    Assessment/Plan: Ileal diverticulitis with contained perforation Dementia DM HTN Atrial fibrillation  IV antibiotics Hold eliquis as you are doing. Strict NPO other than meds May need surgery if no improvement.     Heidi George 03/06/2016, 7:35 AM

## 2016-03-06 NOTE — H&P (Signed)
History and Physical    Heidi George WJX:[id number] DOB: 02/04/1931 DOA: 03/05/2016  PCP: No primary care provider on file.   Patient coming from: Memory care unit  Chief Complaint: Abnormal behavior  HPI: Heidi George is a 82 y.o. woman with a history of Alzheimer's dementia (she is disoriented at baseline but recognizes family; engages in "casual"conversation), HTN, hypothyroidism, DM, and atrial fibrillation (CHADS-Vasc score of 4, anticoagulated with Eliquis) who has had atypical behaviors within the 24 hours prior to presentation.  Because of her dementia, she cannot really articulate her symptoms, and she never really indicated or localized pain.  However, caregivers in her memory unit noticed increased weakness, decreased activity level, and apparent confusion (different from her baseline).  She also reportedly had a fever documented there.  Family was notified and the patient was transferred to the ED by EMS for further evaluation.  Currently, she denies any pain, anywhere.  She is calm but slightly hard of hearing.  ED Course: CT of the abdomen and pelvis shows acute diverticulitis at the proximal to mid ileum with focal mesenteric inflammation and a 1.8cm focus of fluid and air adjacent to the bowel, concerning for mild contained perforation extending into the mesentery.  She has a mild leukocytosis of 13.8.  Bicarb 20, BUN 21, Creatinine 1.1.  She has received empiric cipro and flagyl IV.  U/A does not appear to be infected.  Urine culture pending.  General surgery has been consulted from the ED.  Hospitalist asked to admit.    Review of Systems: Unable to obtain due to patient's dementia.   Past Medical History:  Diagnosis Date  . Alzheimer's dementia   . Atrial fibrillation (HCC)   . Diabetes mellitus without complication (HCC)   . Hypertension   . Hypothyroidism     PAST SURGICAL HISTORY: Hysterectomy   has an unknown smoking status. She has never used smokeless  tobacco. She reports that she does not drink alcohol or use drugs.  She is widow.  She was exposed to second hand smoke.  She has two adult children, who share medical POA.  Allergies  Allergen Reactions  . Promethazine Other (See Comments)    On MAR     Family History: Multiple brothers died of complications related to premature onset CAD.  Prior to Admission medications   Medication Sig Start Date End Date Taking? Authorizing Provider  apixaban (ELIQUIS) 2.5 MG TABS tablet Take 2.5 mg by mouth 2 (two) times daily.   Yes Historical Provider, MD  cloNIDine (CATAPRES) 0.1 MG tablet Take 0.1 mg by mouth 2 (two) times daily.   Yes Historical Provider, MD  donepezil (ARICEPT) 10 MG tablet Take 10 mg by mouth at bedtime.   Yes Historical Provider, MD  ferrous sulfate 325 (65 FE) MG tablet Take 325 mg by mouth daily with breakfast.   Yes Historical Provider, MD  levothyroxine (SYNTHROID, LEVOTHROID) 150 MCG tablet Take 150 mcg by mouth daily before breakfast.   Yes Historical Provider, MD  losartan (COZAAR) 100 MG tablet Take 100 mg by mouth daily.   Yes Historical Provider, MD  memantine (NAMENDA XR) 28 MG CP24 24 hr capsule Take 28 mg by mouth daily.   Yes Historical Provider, MD  metFORMIN (GLUCOPHAGE-XR) 500 MG 24 hr tablet Take 500 mg by mouth daily with breakfast.   Yes Historical Provider, MD  OVER THE COUNTER MEDICATION Take 2 tablets by mouth daily. Vitafusion Calcium gummy   Yes Historical Provider, MD  spironolactone (ALDACTONE) 25  MG tablet Take 25 mg by mouth 2 (two) times daily.   Yes Historical Provider, MD    Physical Exam: Vitals:   03/06/16 0230 03/06/16 0245 03/06/16 0300 03/06/16 0330  BP: 132/85 147/72 162/65 152/99  Pulse: 75 76 76 94  Resp: 15  17 23   Temp:      TempSrc:      SpO2: 98% 98% 96% 97%  Weight:      Height:          Constitutional: NAD, calm, comfortable Vitals:   03/06/16 0230 03/06/16 0245 03/06/16 0300 03/06/16 0330  BP: 132/85 147/72  162/65 152/99  Pulse: 75 76 76 94  Resp: 15  17 23   Temp:      TempSrc:      SpO2: 98% 98% 96% 97%  Weight:      Height:       Eyes: PERRL, lids and conjunctivae normal ENMT: Mucous membranes are moist. Posterior pharynx clear of any exudate or lesions. Normal dentition.  Neck: normal appearance, supple Respiratory: clear to auscultation bilaterally, no wheezing, no crackles. Normal respiratory effort. No accessory muscle use.  Cardiovascular: Normal rate, regular rhythm, no murmurs / rubs / gallops. No extremity edema. 2+ pedal pulses.  GI: abdomen is distended but soft and compressible.  No guarding.  Bowel sounds are present.   Musculoskeletal:  No joint deformity in upper and lower extremities. Good ROM, no contractures. Normal muscle tone.  Skin: no rashes, warm and dry Neurologic: No focal defitics. Psychiatric: Flat affect.  Disoriented at baseline.  She lack judgement/insight due to her history of dementia.    Labs on Admission: I have personally reviewed following labs and imaging studies  CBC:  Recent Labs Lab 03/05/16 2250  WBC 13.8*  HGB 11.2*  HCT 33.8*  MCV 93.1  PLT 227   Basic Metabolic Panel:  Recent Labs Lab 03/05/16 2250  NA 136  K 4.2  CL 105  CO2 20*  GLUCOSE 219*  BUN 21*  CREATININE 1.10*  CALCIUM 9.4   GFR: Estimated Creatinine Clearance: 36.3 mL/min (by C-G formula based on SCr of 1.1 mg/dL (H)). Liver Function Tests:  Recent Labs Lab 03/05/16 2250  AST 30  ALT 25  ALKPHOS 38  BILITOT 0.6  PROT 7.9  ALBUMIN 3.4*   Coagulation Profile:  Recent Labs Lab 03/05/16 2250  INR 1.20   Urine analysis:    Component Value Date/Time   COLORURINE YELLOW 03/06/2016 0005   APPEARANCEUR CLEAR 03/06/2016 0005   LABSPEC 1.013 03/06/2016 0005   PHURINE 5.5 03/06/2016 0005   GLUCOSEU NEGATIVE 03/06/2016 0005   HGBUR NEGATIVE 03/06/2016 0005   BILIRUBINUR NEGATIVE 03/06/2016 0005   KETONESUR NEGATIVE 03/06/2016 0005   PROTEINUR 30  (A) 03/06/2016 0005   NITRITE NEGATIVE 03/06/2016 0005   LEUKOCYTESUR NEGATIVE 03/06/2016 0005    Radiological Exams on Admission: Dg Chest 2 View  Result Date: 03/06/2016 CLINICAL DATA:  Fever for 1 day. EXAM: CHEST  2 VIEW COMPARISON:  None. FINDINGS: No airspace consolidation. No effusions. Mild interstitial prominence may represent interstitial infiltrate or interstitial fluid. Mild cardiomegaly. IMPRESSION: Mild interstitial fluid or thickening. This could represent a degree of congestive heart failure. No airspace consolidation or effusion Electronically Signed   By: Ellery Plunkaniel R Mitchell M.D.   On: 03/06/2016 00:52   Ct Abdomen Pelvis W Contrast  Result Date: 03/06/2016 CLINICAL DATA:  Acute onset of fever.  Initial encounter. EXAM: CT ABDOMEN AND PELVIS WITH CONTRAST TECHNIQUE: Multidetector CT imaging of  the abdomen and pelvis was performed using the standard protocol following bolus administration of intravenous contrast. CONTRAST:  100mL ISOVUE-300 IOPAMIDOL (ISOVUE-300) INJECTION 61% COMPARISON:  CT of the abdomen and pelvis from 06/04/2011 FINDINGS: Lower chest: Mild bibasilar peripheral opacities may reflect mild fibrotic change and atelectasis. Hepatobiliary: The liver is unremarkable in appearance. The patient is status post cholecystectomy, with clips noted at the gallbladder fossa. The common bile duct remains normal in caliber. Pancreas: The pancreas is within normal limits. Spleen: The spleen is unremarkable in appearance. Adrenals/Urinary Tract: The adrenal glands are unremarkable in appearance. The kidneys are within normal limits. There is no evidence of hydronephrosis. No renal or ureteral stones are identified. Nonspecific perinephric stranding is noted bilaterally. Stomach/Bowel: The stomach is unremarkable in appearance. Scattered diverticulosis is noted along the proximal to mid ileum at the left side of the abdomen, with focal mesenteric inflammation tracking proximally, and  associated small focus of fluid and air adjacent to the bowel measuring 1.8 cm, raising concern for mild contained perforation extending into the mesentery. A prominent duodenal diverticulum is noted at the pancreatic head. The appendix is not visualized; there is no evidence for appendicitis. Mild diverticulosis is noted along the sigmoid colon. Vascular/Lymphatic: Scattered calcification is seen along the abdominal aorta and its branches. The abdominal aorta is otherwise grossly unremarkable. The inferior vena cava is grossly unremarkable. No retroperitoneal lymphadenopathy is seen. No pelvic sidewall lymphadenopathy is identified. Reproductive: The bladder is mildly distended and grossly unremarkable. The patient is status post hysterectomy. No suspicious adnexal masses are seen. Other: No additional soft tissue abnormalities are seen. Musculoskeletal: No acute osseous abnormalities are identified. Vacuum phenomenon is noted at L5-S1. The visualized musculature is unremarkable in appearance. IMPRESSION: 1. Acute diverticulitis at the proximal to mid ileum at the left side of the abdomen, with focal mesenteric inflammation tracking proximally. Small focus of fluid and air adjacent to the bowel measures 1.8 cm, concerning for mild contained perforation extending into the mesentery. 2. Scattered diverticulosis along the proximal to mid ileum. Prominent duodenal diverticulum also noted at the pancreatic head. Mild diverticulosis along the sigmoid colon. 3. Mild bibasilar peripheral opacities may reflect mild fibrotic change and atelectasis. 4. Scattered aortic atherosclerosis. These results were called by telephone at the time of interpretation on 03/06/2016 at 2:47 am to Dr. Tomasita CrumbleADELEKE ONI, who verbally acknowledged these results. Electronically Signed   By: Roanna RaiderJeffery  Chang M.D.   On: 03/06/2016 02:49      Assessment/Plan Principal Problem:   Diverticulitis Active Problems:   Atrial fibrillation with controlled  ventricular response (HCC)   HTN (hypertension)   Diabetes (HCC)   Hypothyroidism      Acute diverticulitis complicated by probable microperforation --Place in observation for now --IV cipro and flagyl --General Surgery to see --NPO except meds now --Analgesics and anti-emetics as needed --HOLD Eliquis in case she needs invasive intervention --Add blood cultures (fever documented in the ED) --Check lactic acid level to screen for sepsis (She has a mild metabolic acidosis) --S/P 1L NS in the ED.  Maintenance fluids at 50cc/hr for now  History of paroxysmal atrial fibrillation --Hold Eliquis for now  HTN --Continue Clonidine, cozaar, spironolactone for now  History of dementia --Continue aricept and namenda  Hypothyroidism --Continue current dose of levothyroxine  DM --HOLD metformin --SSI coverage AC/HS   DVT prophylaxis: SCDs, holding Eliquis in case she needs invasive intervention Code Status: FULL (per her son) Family Communication: Patient's son present in the ED at time of admission  Disposition Plan: To be determined Consults called: General Surgery Admission status: Place in observation, med surg   TIME SPENT: 60 minutes   Jerene Bears MD [facility] Pager [phone]  If 7PM-7AM, please contact night-coverage [email or web address] Password [email or web address]  03/06/2016, 4:05 AM

## 2016-03-06 NOTE — Progress Notes (Signed)
CRITICAL VALUE ALERT  Critical value received:  Lactic acid 2.1  Date of notification:  03/06/16  Time of notification:  0830  Critical value read back:Yes.    Nurse who received alert:  Shanda BumpsMarissa Jamine Highfill  MD notified (1st page):  Dr Derrell Lollingamirez  Time of first page:  [phone]  MD notified (2nd page):  Time of second page:  Responding MD:  Dr Andrey CampanileWilson is in the unit, notified personally, I will also notify attending, Dr D. Janee Mornhompson, message sent thru Amnion.  Time MD responded:  [phone]

## 2016-03-06 NOTE — Progress Notes (Signed)
Pharmacy Antibiotic Note  Solon AugustaSybil Cermak is a 82 y.o. female admitted on 03/05/2016 with intra-abdominal infection.  Pharmacy has been consulted for Cipro dosing.  Plan: Cipro 400mg  IV Q12H.  Pharmacy will sign off.  Height: 5\' 6"  (167.6 cm) Weight: 150 lb (68 kg) IBW/kg (Calculated) : 59.3  Temp (24hrs), Avg:100 F (37.8 C), Min:98.4 F (36.9 C), Max:102.3 F (39.1 C)   Recent Labs Lab 03/05/16 2250  WBC 13.8*  CREATININE 1.10*    Estimated Creatinine Clearance: 36.3 mL/min (by C-G formula based on SCr of 1.1 mg/dL (H)).    Allergies  Allergen Reactions  . Promethazine Other (See Comments)    On MAR       Thank you for allowing pharmacy to be a part of this patient's care.  Vernard GamblesVeronda Villa Burgin, PharmD, BCPS  03/06/2016 4:22 AM

## 2016-03-06 NOTE — Progress Notes (Signed)
I have seen and assessed patient and agree with Dr. Celene Skeenarter's assessment and plan. Patient is a pleasant 82 year old lady with history of Alzheimer's dementia presenting with diverticulitis and contained microperforation. Continue empiric IV antibiotics of Cipro and Flagyl. General surgery following.

## 2016-03-07 LAB — GLUCOSE, CAPILLARY
GLUCOSE-CAPILLARY: 118 mg/dL — AB (ref 65–99)
GLUCOSE-CAPILLARY: 146 mg/dL — AB (ref 65–99)
GLUCOSE-CAPILLARY: 128 mg/dL — AB (ref 65–99)
Glucose-Capillary: 129 mg/dL — ABNORMAL HIGH (ref 65–99)
Glucose-Capillary: 134 mg/dL — ABNORMAL HIGH (ref 65–99)
Glucose-Capillary: 144 mg/dL — ABNORMAL HIGH (ref 65–99)
Glucose-Capillary: 94 mg/dL (ref 65–99)

## 2016-03-07 LAB — BASIC METABOLIC PANEL
ANION GAP: 7 (ref 5–15)
BUN: 12 mg/dL (ref 6–20)
CALCIUM: 8.5 mg/dL — AB (ref 8.9–10.3)
CO2: 25 mmol/L (ref 22–32)
Chloride: 107 mmol/L (ref 101–111)
Creatinine, Ser: 0.95 mg/dL (ref 0.44–1.00)
GFR, EST NON AFRICAN AMERICAN: 54 mL/min — AB (ref >60)
GLUCOSE: 154 mg/dL — AB (ref 65–99)
POTASSIUM: 3.3 mmol/L — AB (ref 3.5–5.1)
Sodium: 139 mmol/L (ref 135–145)

## 2016-03-07 LAB — CBC
HEMATOCRIT: 31.4 % — AB (ref 36.0–46.0)
Hemoglobin: 10.1 g/dL — ABNORMAL LOW (ref 12.0–15.0)
MCH: 30.8 pg (ref 26.0–34.0)
MCHC: 32.2 g/dL (ref 30.0–36.0)
MCV: 95.7 fL (ref 78.0–100.0)
PLATELETS: 184 10*3/uL (ref 150–400)
RBC: 3.28 MIL/uL — AB (ref 3.87–5.11)
RDW: 14.7 % (ref 11.5–15.5)
WBC: 7.7 10*3/uL (ref 4.0–10.5)

## 2016-03-07 LAB — HEPARIN LEVEL (UNFRACTIONATED): HEPARIN UNFRACTIONATED: 0.48 [IU]/mL (ref 0.30–0.70)

## 2016-03-07 LAB — URINE CULTURE: CULTURE: NO GROWTH

## 2016-03-07 LAB — HEMOGLOBIN A1C
Hgb A1c MFr Bld: 7 % — ABNORMAL HIGH (ref 4.8–5.6)
MEAN PLASMA GLUCOSE: 154 mg/dL

## 2016-03-07 LAB — APTT: APTT: 61 s — AB (ref 24–36)

## 2016-03-07 MED ORDER — CLONIDINE HCL 0.2 MG PO TABS
0.2000 mg | ORAL_TABLET | Freq: Two times a day (BID) | ORAL | Status: DC
Start: 2016-03-07 — End: 2016-03-12
  Administered 2016-03-07 – 2016-03-12 (×10): 0.2 mg via ORAL
  Filled 2016-03-07 (×10): qty 1

## 2016-03-07 MED ORDER — SODIUM CHLORIDE 0.9 % IV SOLN
INTRAVENOUS | Status: DC
  Administered 2016-03-07 – 2016-03-08 (×4): via INTRAVENOUS
  Filled 2016-03-07 (×8): qty 1000

## 2016-03-07 MED ORDER — WHITE PETROLATUM GEL
Status: AC
  Administered 2016-03-07: 1
  Filled 2016-03-07: qty 1

## 2016-03-07 MED ORDER — POTASSIUM CHLORIDE CRYS ER 20 MEQ PO TBCR
40.0000 meq | EXTENDED_RELEASE_TABLET | Freq: Once | ORAL | Status: AC
  Administered 2016-03-07: 40 meq via ORAL
  Filled 2016-03-07: qty 2

## 2016-03-07 MED ORDER — HEPARIN (PORCINE) IN NACL 100-0.45 UNIT/ML-% IJ SOLN
1050.0000 [IU]/h | INTRAMUSCULAR | Status: DC
  Administered 2016-03-07: 800 [IU]/h via INTRAVENOUS
  Administered 2016-03-08 – 2016-03-11 (×5): 950 [IU]/h via INTRAVENOUS
  Filled 2016-03-07 (×10): qty 250

## 2016-03-07 MED ORDER — ORAL CARE MOUTH RINSE
15.0000 mL | Freq: Two times a day (BID) | OROMUCOSAL | Status: DC
  Administered 2016-03-07: 15 mL via OROMUCOSAL

## 2016-03-07 NOTE — Progress Notes (Signed)
ANTICOAGULATION CONSULT NOTE - Initial Consult  Pharmacy Consult:  Heparin Indication: atrial fibrillation  Allergies  Allergen Reactions  . Promethazine Other (See Comments)    On MAR     Patient Measurements: Height: 5\' 6"  (167.6 cm) Weight: 125 lb 10.6 oz (57 kg) IBW/kg (Calculated) : 59.3 Heparin Dosing Weight: 57 kg  Vital Signs: Temp: 98.3 F (36.8 C) (11/05 0617) BP: 175/59 (11/05 0617) Pulse Rate: 62 (11/05 0617)  Labs:  Recent Labs  03/05/16 2250 03/06/16 1017 03/07/16 0557  HGB 11.2* 10.5* 10.1*  HCT 33.8* 32.1* 31.4*  PLT 227 199 184  LABPROT 15.3*  --   --   INR 1.20  --   --   CREATININE 1.10* 0.95 0.95    Estimated Creatinine Clearance: 40.4 mL/min (by C-G formula based on SCr of 0.95 mg/dL).   Medical History: Past Medical History:  Diagnosis Date  . Alzheimer's dementia   . Atrial fibrillation (HCC)   . Diabetes mellitus without complication (HCC)   . Hypertension   . Hypothyroidism      Assessment: 89 YOF with history of AFib on Eliquis PTA, last dose 03/05/16.  Pharmacy consulted to transition patient to IV heparin while Eliquis is on hold for possible surgery.  Baseline labs reviewed.   Goal of Therapy:  Heparin level 0.3-0.7 units/ml aPTT 66 - 102 seconds Monitor platelets by anticoagulation protocol: Yes    Plan:  - Start heparin gtt at 800 units/hr - Check 8 hr heparin level and aPTT - Daily aPTT, heparin level, CBC   Courtlynn Holloman D. Laney Potashang, PharmD, BCPS Pager:  [phone] 03/07/2016, 1:20 PM

## 2016-03-07 NOTE — Progress Notes (Signed)
ANTICOAGULATION CONSULT NOTE - Follow-up Consult  Pharmacy Consult:  Heparin Indication: atrial fibrillation  Allergies  Allergen Reactions  . Promethazine Other (See Comments)    On MAR     Patient Measurements: Height: 5\' 6"  (167.6 cm) Weight: 125 lb 10.6 oz (57 kg) IBW/kg (Calculated) : 59.3 Heparin Dosing Weight: 57 kg  Vital Signs: Temp: 98.6 F (37 C) (11/05 2156) Temp Source: Oral (11/05 2156) BP: 170/63 (11/05 2156) Pulse Rate: 82 (11/05 2156)  Labs:  Recent Labs  03/05/16 2250 03/06/16 1017 03/07/16 0557 03/07/16 2153  HGB 11.2* 10.5* 10.1*  --   HCT 33.8* 32.1* 31.4*  --   PLT 227 199 184  --   APTT  --   --   --  61*  LABPROT 15.3*  --   --   --   INR 1.20  --   --   --   HEPARINUNFRC  --   --   --  0.48  CREATININE 1.10* 0.95 0.95  --     Estimated Creatinine Clearance: 40.4 mL/min (by C-G formula based on SCr of 0.95 mg/dL).   Assessment: [age] YOF with history of AFib on Eliquis PTA, last dose 03/05/16.  Pharmacy consulted to transition patient to IV heparin while Eliquis is on hold for possible surgery. Heparin level 0.48 - likely still being affected by Eliquis as not correlating with PTT. PTT 61 sec (subtherapeutic) on gtt at 800 units/hr. No issues with line or bleeding reported per RN.  Goal of Therapy:  Heparin level 0.3-0.7 units/ml aPTT 66 - 102 seconds Monitor platelets by anticoagulation protocol: Yes   Plan:  - Increase heparin gtt to 950 units/hr - Check 8 hr heparin level and aPTT  Christoper Fabianaron Maylee Bare, PharmD, BCPS Clinical pharmacist, pager [phone] 03/07/2016, 10:43 PM

## 2016-03-07 NOTE — Progress Notes (Signed)
Subjective: Denies abd pain/n/v. [facility]   Objective: Vital signs in last 24 hours: Temp:  [98.3 F (36.8 C)-98.7 F (37.1 C)] 98.3 F (36.8 C) (11/05 0617) Pulse Rate:  [62-64] 62 (11/05 0617) Resp:  [16-19] 16 (11/05 0617) BP: (138-183)/(54-75) 175/59 (11/05 0617) SpO2:  [94 %-98 %] 96 % (11/05 0617) Last BM Date:  (PTA)  Intake/Output from previous day: 11/04 0701 - 11/05 0700 In: 700 [I.V.:400; IV Piggyback:300] Out: 750 [Urine:750] Intake/Output this shift: Total I/O In: -  Out: 600 [Urine:600]  Awake, alert, not oriented Not ill appearing cta  Not tachy Soft, very mild distension, mild TTP mainly in lower abd. A little voluntary guarding but no rebound/peritonitis  Lab Results:   Recent Labs  03/06/16 1017 03/07/16 0557  WBC 11.3* 7.7  HGB 10.5* 10.1*  HCT 32.1* 31.4*  PLT 199 184   BMET  Recent Labs  03/06/16 1017 03/07/16 0557  NA 139 139  K 3.7 3.3*  CL 110 107  CO2 23 25  GLUCOSE 142* 154*  BUN 15 12  CREATININE 0.95 0.95  CALCIUM 8.8* 8.5*   PT/INR  Recent Labs  03/05/16 2250  LABPROT 15.3*  INR 1.20   ABG No results for input(s): PHART, HCO3 in the last 72 hours.  Invalid input(s): PCO2, PO2  Studies/Results: Dg Chest 2 View  Result Date: 03/06/2016 CLINICAL DATA:  Fever for 1 day. EXAM: CHEST  2 VIEW COMPARISON:  None. FINDINGS: No airspace consolidation. No effusions. Mild interstitial prominence may represent interstitial infiltrate or interstitial fluid. Mild cardiomegaly. IMPRESSION: Mild interstitial fluid or thickening. This could represent a degree of congestive heart failure. No airspace consolidation or effusion Electronically Signed   By: Ellery Plunkaniel R Mitchell M.D.   On: 03/06/2016 00:52   Ct Abdomen Pelvis W Contrast  Result Date: 03/06/2016 CLINICAL DATA:  Acute onset of fever.  Initial encounter. EXAM: CT ABDOMEN AND PELVIS WITH CONTRAST TECHNIQUE: Multidetector CT imaging of the abdomen and pelvis was  performed using the standard protocol following bolus administration of intravenous contrast. CONTRAST:  100mL ISOVUE-300 IOPAMIDOL (ISOVUE-300) INJECTION 61% COMPARISON:  CT of the abdomen and pelvis from 06/04/2011 FINDINGS: Lower chest: Mild bibasilar peripheral opacities may reflect mild fibrotic change and atelectasis. Hepatobiliary: The liver is unremarkable in appearance. The patient is status post cholecystectomy, with clips noted at the gallbladder fossa. The common bile duct remains normal in caliber. Pancreas: The pancreas is within normal limits. Spleen: The spleen is unremarkable in appearance. Adrenals/Urinary Tract: The adrenal glands are unremarkable in appearance. The kidneys are within normal limits. There is no evidence of hydronephrosis. No renal or ureteral stones are identified. Nonspecific perinephric stranding is noted bilaterally. Stomach/Bowel: The stomach is unremarkable in appearance. Scattered diverticulosis is noted along the proximal to mid ileum at the left side of the abdomen, with focal mesenteric inflammation tracking proximally, and associated small focus of fluid and air adjacent to the bowel measuring 1.8 cm, raising concern for mild contained perforation extending into the mesentery. A prominent duodenal diverticulum is noted at the pancreatic head. The appendix is not visualized; there is no evidence for appendicitis. Mild diverticulosis is noted along the sigmoid colon. Vascular/Lymphatic: Scattered calcification is seen along the abdominal aorta and its branches. The abdominal aorta is otherwise grossly unremarkable. The inferior vena cava is grossly unremarkable. No retroperitoneal lymphadenopathy is seen. No pelvic sidewall lymphadenopathy is identified. Reproductive: The bladder is mildly distended and grossly unremarkable. The patient is status post hysterectomy. No suspicious adnexal  masses are seen. Other: No additional soft tissue abnormalities are seen.  Musculoskeletal: No acute osseous abnormalities are identified. Vacuum phenomenon is noted at L5-S1. The visualized musculature is unremarkable in appearance. IMPRESSION: 1. Acute diverticulitis at the proximal to mid ileum at the left side of the abdomen, with focal mesenteric inflammation tracking proximally. Small focus of fluid and air adjacent to the bowel measures 1.8 cm, concerning for mild contained perforation extending into the mesentery. 2. Scattered diverticulosis along the proximal to mid ileum. Prominent duodenal diverticulum also noted at the pancreatic head. Mild diverticulosis along the sigmoid colon. 3. Mild bibasilar peripheral opacities may reflect mild fibrotic change and atelectasis. 4. Scattered aortic atherosclerosis. These results were called by telephone at the time of interpretation on 03/06/2016 at 2:47 am to Dr. Tomasita CrumbleADELEKE ONI, who verbally acknowledged these results. Electronically Signed   By: Roanna RaiderJeffery  Chang M.D.   On: 03/06/2016 02:49    Anti-infectives: Anti-infectives    Start     Dose/Rate Route Frequency Ordered Stop   03/06/16 0500  metroNIDAZOLE (FLAGYL) IVPB 500 mg     500 mg 100 mL/hr over 60 Minutes Intravenous Every 8 hours 03/06/16 0431     03/06/16 0500  ciprofloxacin (CIPRO) IVPB 400 mg     400 mg 200 mL/hr over 60 Minutes Intravenous Every 12 hours 03/06/16 0423     03/06/16 0345  ciprofloxacin (CIPRO) tablet 500 mg  Status:  Discontinued     500 mg Oral  Once 03/06/16 0334 03/06/16 0343   03/06/16 0345  metroNIDAZOLE (FLAGYL) tablet 500 mg  Status:  Discontinued     500 mg Oral  Once 03/06/16 0334 03/06/16 0343   03/06/16 0345  ciprofloxacin (CIPRO) IVPB 400 mg  Status:  Discontinued     400 mg 200 mL/hr over 60 Minutes Intravenous  Once 03/06/16 0343 03/06/16 0410   03/06/16 0345  metroNIDAZOLE (FLAGYL) IVPB 500 mg  Status:  Discontinued     500 mg 100 mL/hr over 60 Minutes Intravenous  Once 03/06/16 0343 03/06/16 0410      Assessment/Plan: Ileal  diverticulitis with contained perforation Dementia DM HTN Atrial fibrillation  No fever. No tachy. Wbc normalizing. abd exam improved so I think we can cont non surgical mgmt Cont bowel rest today Cont IV abx Would cont to hold ORAL anticoag Can have iv heparin from our standpoint Discussed with daughter that still a possibility of surgery.  Ambulate  Mary SellaEric M. Andrey CampanileWilson, MD, FACS General, Bariatric, & Minimally Invasive Surgery [facility] [place]   LOS: 1 day    Atilano InaWILSON,Breanna Mcdaniel M 03/07/2016

## 2016-03-07 NOTE — Progress Notes (Signed)
PROGRESS NOTE    Heidi George  WUJ:[id number] DOB: 17-Feb-1931 DOA: 03/05/2016 PCP: No primary care provider on file.    Brief Narrative:  Patient is a 82 year old female history of Alzheimer's dementia who is disoriented at baseline by recognizes family, hypertension, hypothyroidism, diabetes, atrial fibrillation presented to the ED with atypical behaviors and found to have a acute diverticulitis with contained perforation.   Assessment & Plan:   Principal Problem:   Diverticulitis of large intestine with perforation Active Problems:   Atrial fibrillation with controlled ventricular response (HCC)   Diverticulitis   HTN (hypertension)   Diabetes (HCC)   Hypothyroidism   Acute diverticulitis  #1 acute diverticulitis with contained perforation. Patient denies any abdominal pain however on examination does have left lower quadrant tenderness to palpation. Patient is afebrile. WBC trending down. Continue empiric IV ciprofloxacin and IV Flagyl. Patient currently on bowel rest. General surgery following and appreciate input and recommendations.  #2 hypokalemia Replete.  #3 hypertension Increase clonidine to 0.2 mg twice daily. Continue home regimen of Cozaar, spironolactone.  4 hypothyroidism Continue Synthroid.  #5 diabetes mellitus Hemoglobin A1c 7.0. CBGs have ranged from 118 -146. Sliding scale insulin.  #6 atrial fibrillation Rate control. Eliquis on hold however will bridge with IV heparin until has improved clinically and no anticipation for surgical procedures.  #7 dementia Continue Aricept and Namenda     DVT prophylaxis: SCDs Code Status: Full Family Communication: No family at bedside. Disposition Plan: Back to facility once medically stable with improvement with diverticulitis and tolerating oral intake and per general surgery.   Consultants:   Gen. surgery: Dr. Donell Beers 03/06/2016  Procedures:   CT abdomen and pelvis 03/06/2016  Chest x-ray  03/06/2016  Antimicrobials:  IV ciprofloxacin 03/06/2016  IV Flagyl 03/06/2016   Subjective: Patient sleeping easily arousable. Patient denies any abdominal pain, no nausea no vomiting. However on examination patient with left lower quadrant tenderness.  Objective: Vitals:   03/06/16 2108 03/06/16 2115 03/07/16 0617 03/07/16 1255  BP: (!) 183/59 (!) 167/75 (!) 175/59 (!) 178/69  Pulse: 64  62 61  Resp: 19  16 16   Temp: 98.7 F (37.1 C)  98.3 F (36.8 C) 98.4 F (36.9 C)  TempSrc: Oral   Oral  SpO2: 94%  96% 95%  Weight:      Height:        Intake/Output Summary (Last 24 hours) at 03/07/16 1851 Last data filed at 03/07/16 1558  Gross per 24 hour  Intake           1146.8 ml  Output             2200 ml  Net          -1053.2 ml   Filed Weights   03/05/16 2150 03/06/16 0430  Weight: 68 kg (150 lb) 57 kg (125 lb 10.6 oz)    Examination:  General exam: Appears calm and comfortable  Respiratory system: Clear to auscultation. Respiratory effort normal. Cardiovascular system: S1 & S2 heard, RRR. No JVD, murmurs, rubs, gallops or clicks. No pedal edema. Gastrointestinal system: Abdomen is nondistended, soft and tender to palpation in the left lower quadrant. No organomegaly or masses felt. Normal bowel sounds heard. Central nervous system: Alert and oriented. No focal neurological deficits. Extremities: Symmetric 5 x 5 power. Skin: No rashes, lesions or ulcers Psychiatry: Judgement and insight appear normal. Mood & affect appropriate.     Data Reviewed: I have personally reviewed following labs and imaging studies  CBC:  Recent Labs Lab 03/05/16 2250 03/06/16 1017 03/07/16 0557  WBC 13.8* 11.3* 7.7  NEUTROABS  --  8.3*  --   HGB 11.2* 10.5* 10.1*  HCT 33.8* 32.1* 31.4*  MCV 93.1 93.6 95.7  PLT 227 199 184   Basic Metabolic Panel:  Recent Labs Lab 03/05/16 2250 03/06/16 1017 03/07/16 0557  NA 136 139 139  K 4.2 3.7 3.3*  CL 105 110 107  CO2 20* 23 25   GLUCOSE 219* 142* 154*  BUN 21* 15 12  CREATININE 1.10* 0.95 0.95  CALCIUM 9.4 8.8* 8.5*   GFR: Estimated Creatinine Clearance: 40.4 mL/min (by C-G formula based on SCr of 0.95 mg/dL). Liver Function Tests:  Recent Labs Lab 03/05/16 2250 03/06/16 1017  AST 30 19  ALT 25 20  ALKPHOS 38 31*  BILITOT 0.6 0.6  PROT 7.9 7.1  ALBUMIN 3.4* 3.1*   No results for input(s): LIPASE, AMYLASE in the last 168 hours. No results for input(s): AMMONIA in the last 168 hours. Coagulation Profile:  Recent Labs Lab 03/05/16 2250  INR 1.20   Cardiac Enzymes: No results for input(s): CKTOTAL, CKMB, CKMBINDEX, TROPONINI in the last 168 hours. BNP (last 3 results) No results for input(s): PROBNP in the last 8760 hours. HbA1C:  Recent Labs  03/06/16 0440  HGBA1C 7.0*   CBG:  Recent Labs Lab 03/07/16 0002 03/07/16 0342 03/07/16 0758 03/07/16 1245 03/07/16 1635  GLUCAP 144* 134* 118* 146* 128*   Lipid Profile: No results for input(s): CHOL, HDL, LDLCALC, TRIG, CHOLHDL, LDLDIRECT in the last 72 hours. Thyroid Function Tests: No results for input(s): TSH, T4TOTAL, FREET4, T3FREE, THYROIDAB in the last 72 hours. Anemia Panel: No results for input(s): VITAMINB12, FOLATE, FERRITIN, TIBC, IRON, RETICCTPCT in the last 72 hours. Sepsis Labs:  Recent Labs Lab 03/06/16 0440 03/06/16 0707  LATICACIDVEN 1.6 2.1*    Recent Results (from the past 240 hour(s))  Urine culture     Status: None   Collection Time: 03/06/16 12:05 AM  Result Value Ref Range Status   Specimen Description URINE, CATHETERIZED  Final   Special Requests NONE  Final   Culture NO GROWTH  Final   Report Status 03/07/2016 FINAL  Final  Culture, blood (Routine X 2) w Reflex to ID Panel     Status: None (Preliminary result)   Collection Time: 03/06/16  4:41 AM  Result Value Ref Range Status   Specimen Description BLOOD RIGHT ARM  Final   Special Requests BOTTLES DRAWN AEROBIC AND ANAEROBIC 10ML  Final   Culture  NO GROWTH 1 DAY  Final   Report Status PENDING  Incomplete  Culture, blood (Routine X 2) w Reflex to ID Panel     Status: None (Preliminary result)   Collection Time: 03/06/16  4:46 AM  Result Value Ref Range Status   Specimen Description BLOOD LEFT ARM  Final   Special Requests BOTTLES DRAWN AEROBIC AND ANAEROBIC 6ML  Final   Culture NO GROWTH 1 DAY  Final   Report Status PENDING  Incomplete  MRSA PCR Screening     Status: None   Collection Time: 03/06/16  4:57 AM  Result Value Ref Range Status   MRSA by PCR NEGATIVE NEGATIVE Final    Comment:        The GeneXpert MRSA Assay (FDA approved for NASAL specimens only), is one component of a comprehensive MRSA colonization surveillance program. It is not intended to diagnose MRSA infection nor to guide or monitor treatment for MRSA infections.  Radiology Studies: Dg Chest 2 View  Result Date: 03/06/2016 CLINICAL DATA:  Fever for 1 day. EXAM: CHEST  2 VIEW COMPARISON:  None. FINDINGS: No airspace consolidation. No effusions. Mild interstitial prominence may represent interstitial infiltrate or interstitial fluid. Mild cardiomegaly. IMPRESSION: Mild interstitial fluid or thickening. This could represent a degree of congestive heart failure. No airspace consolidation or effusion Electronically Signed   By: Ellery Plunkaniel R Mitchell M.D.   On: 03/06/2016 00:52   Ct Abdomen Pelvis W Contrast  Result Date: 03/06/2016 CLINICAL DATA:  Acute onset of fever.  Initial encounter. EXAM: CT ABDOMEN AND PELVIS WITH CONTRAST TECHNIQUE: Multidetector CT imaging of the abdomen and pelvis was performed using the standard protocol following bolus administration of intravenous contrast. CONTRAST:  100mL ISOVUE-300 IOPAMIDOL (ISOVUE-300) INJECTION 61% COMPARISON:  CT of the abdomen and pelvis from 06/04/2011 FINDINGS: Lower chest: Mild bibasilar peripheral opacities may reflect mild fibrotic change and atelectasis. Hepatobiliary: The liver is unremarkable  in appearance. The patient is status post cholecystectomy, with clips noted at the gallbladder fossa. The common bile duct remains normal in caliber. Pancreas: The pancreas is within normal limits. Spleen: The spleen is unremarkable in appearance. Adrenals/Urinary Tract: The adrenal glands are unremarkable in appearance. The kidneys are within normal limits. There is no evidence of hydronephrosis. No renal or ureteral stones are identified. Nonspecific perinephric stranding is noted bilaterally. Stomach/Bowel: The stomach is unremarkable in appearance. Scattered diverticulosis is noted along the proximal to mid ileum at the left side of the abdomen, with focal mesenteric inflammation tracking proximally, and associated small focus of fluid and air adjacent to the bowel measuring 1.8 cm, raising concern for mild contained perforation extending into the mesentery. A prominent duodenal diverticulum is noted at the pancreatic head. The appendix is not visualized; there is no evidence for appendicitis. Mild diverticulosis is noted along the sigmoid colon. Vascular/Lymphatic: Scattered calcification is seen along the abdominal aorta and its branches. The abdominal aorta is otherwise grossly unremarkable. The inferior vena cava is grossly unremarkable. No retroperitoneal lymphadenopathy is seen. No pelvic sidewall lymphadenopathy is identified. Reproductive: The bladder is mildly distended and grossly unremarkable. The patient is status post hysterectomy. No suspicious adnexal masses are seen. Other: No additional soft tissue abnormalities are seen. Musculoskeletal: No acute osseous abnormalities are identified. Vacuum phenomenon is noted at L5-S1. The visualized musculature is unremarkable in appearance. IMPRESSION: 1. Acute diverticulitis at the proximal to mid ileum at the left side of the abdomen, with focal mesenteric inflammation tracking proximally. Small focus of fluid and air adjacent to the bowel measures 1.8 cm,  concerning for mild contained perforation extending into the mesentery. 2. Scattered diverticulosis along the proximal to mid ileum. Prominent duodenal diverticulum also noted at the pancreatic head. Mild diverticulosis along the sigmoid colon. 3. Mild bibasilar peripheral opacities may reflect mild fibrotic change and atelectasis. 4. Scattered aortic atherosclerosis. These results were called by telephone at the time of interpretation on 03/06/2016 at 2:47 am to Dr. Tomasita CrumbleADELEKE ONI, who verbally acknowledged these results. Electronically Signed   By: Roanna RaiderJeffery  Chang M.D.   On: 03/06/2016 02:49        Scheduled Meds: . ciprofloxacin  400 mg Intravenous Q12H  . cloNIDine  0.2 mg Oral BID  . donepezil  10 mg Oral QHS  . insulin aspart  0-15 Units Subcutaneous Q4H  . levothyroxine  150 mcg Oral QAC breakfast  . losartan  100 mg Oral Daily  . mouth rinse  15 mL Mouth Rinse BID  .  memantine  28 mg Oral Daily  . metronidazole  500 mg Intravenous Q8H  . spironolactone  25 mg Oral BID   Continuous Infusions: . heparin 800 Units/hr (03/07/16 1417)  . sodium chloride 0.9 % 1,000 mL with potassium chloride 40 mEq infusion 100 mL/hr at 03/07/16 1138     LOS: 1 day    Time spent: 35 minutes    THOMPSON,DANIEL, MD [facility] Pager [phone]  If 7PM-7AM, please contact night-coverage [email or web address] Password [facility] 03/07/2016, 6:51 PM

## 2016-03-08 LAB — GLUCOSE, CAPILLARY
GLUCOSE-CAPILLARY: 120 mg/dL — AB (ref 65–99)
GLUCOSE-CAPILLARY: 133 mg/dL — AB (ref 65–99)
Glucose-Capillary: 101 mg/dL — ABNORMAL HIGH (ref 65–99)
Glucose-Capillary: 123 mg/dL — ABNORMAL HIGH (ref 65–99)
Glucose-Capillary: 113 mg/dL — ABNORMAL HIGH (ref 65–99)

## 2016-03-08 LAB — BASIC METABOLIC PANEL
ANION GAP: 5 (ref 5–15)
BUN: 12 mg/dL (ref 6–20)
CALCIUM: 8.2 mg/dL — AB (ref 8.9–10.3)
CO2: 21 mmol/L — AB (ref 22–32)
CREATININE: 0.95 mg/dL (ref 0.44–1.00)
Chloride: 112 mmol/L — ABNORMAL HIGH (ref 101–111)
GFR, EST NON AFRICAN AMERICAN: 54 mL/min — AB (ref >60)
Glucose, Bld: 112 mg/dL — ABNORMAL HIGH (ref 65–99)
Potassium: 4.5 mmol/L (ref 3.5–5.1)
SODIUM: 138 mmol/L (ref 135–145)

## 2016-03-08 LAB — HEPARIN LEVEL (UNFRACTIONATED)
HEPARIN UNFRACTIONATED: 0.59 [IU]/mL (ref 0.30–0.70)
HEPARIN UNFRACTIONATED: 0.28 [IU]/mL — AB (ref 0.30–0.70)
HEPARIN UNFRACTIONATED: 0.52 [IU]/mL (ref 0.30–0.70)

## 2016-03-08 LAB — CBC
HEMATOCRIT: 29.6 % — AB (ref 36.0–46.0)
Hemoglobin: 9.8 g/dL — ABNORMAL LOW (ref 12.0–15.0)
MCH: 31 pg (ref 26.0–34.0)
MCHC: 33.1 g/dL (ref 30.0–36.0)
MCV: 93.7 fL (ref 78.0–100.0)
PLATELETS: 204 10*3/uL (ref 150–400)
RBC: 3.16 MIL/uL — ABNORMAL LOW (ref 3.87–5.11)
RDW: 14.5 % (ref 11.5–15.5)
WBC: 9.3 10*3/uL (ref 4.0–10.5)

## 2016-03-08 LAB — MAGNESIUM: MAGNESIUM: 1.4 mg/dL — AB (ref 1.7–2.4)

## 2016-03-08 LAB — APTT: APTT: 100 s — AB (ref 24–36)

## 2016-03-08 MED ORDER — MAGNESIUM SULFATE 4 GM/100ML IV SOLN
4.0000 g | Freq: Once | INTRAVENOUS | Status: AC
  Administered 2016-03-08: 4 g via INTRAVENOUS
  Filled 2016-03-08 (×3): qty 100

## 2016-03-08 NOTE — Progress Notes (Signed)
ANTICOAGULATION CONSULT NOTE - Follow Up Consult  Pharmacy Consult for heparin Indication: atrial fibrillation  Labs:  Recent Labs  03/06/16 1017 03/07/16 0557  03/07/16 2153 03/08/16 0219 03/08/16 0634 03/08/16 1438 03/08/16 2206  HGB 10.5* 10.1*  --   --  9.8*  --   --   --   HCT 32.1* 31.4*  --   --  29.6*  --   --   --   PLT 199 184  --   --  204  --   --   --   APTT  --   --   --  61*  --  100*  --   --   HEPARINUNFRC  --   --   < > 0.48  --  0.59 0.28* 0.52  CREATININE 0.95 0.95  --   --  0.95  --   --   --   < > = values in this interval not displayed.   Assessment/Plan:  82yo female therapeutic on heparin after resumed. Will continue gtt at current rate and confirm stable with am labs.   Vernard GamblesVeronda Laszlo Ellerby, PharmD, BCPS  03/08/2016,11:38 PM

## 2016-03-08 NOTE — Progress Notes (Signed)
Subjective: [place] denies any pain. [place] Does not know her address or the year. On exam she is soft with no tenderness on exam.  Objective: Vital signs in last 24 hours: Temp:  [98.4 F (36.9 C)-98.7 F (37.1 C)] 98.7 F (37.1 C) (11/06 0612) Pulse Rate:  [54-82] 54 (11/06 0612) Resp:  [16] 16 (11/06 0612) BP: (135-178)/(63-75) 135/75 (11/06 0612) SpO2:  [92 %-96 %] 92 % (11/06 0612) Last BM Date:  (PTA) NPO 2700 IV 2400 urine Afebrile, VSS, BP up some Labs are stable.  Intake/Output from previous day: 11/05 0701 - 11/06 0700 In: 2662.3 [I.V.:1962.3; IV Piggyback:700] Out: 2400 [Urine:2400] Intake/Output this shift: No intake/output data recorded.  General appearance: alert, cooperative and no distress GI: soft, non-tender; bowel sounds normal; no masses,  no organomegaly  Lab Results:   Recent Labs  03/07/16 0557 03/08/16 0219  WBC 7.7 9.3  HGB 10.1* 9.8*  HCT 31.4* 29.6*  PLT 184 204    BMET  Recent Labs  03/07/16 0557 03/08/16 0219  NA 139 138  K 3.3* 4.5  CL 107 112*  CO2 25 21*  GLUCOSE 154* 112*  BUN 12 12  CREATININE 0.95 0.95  CALCIUM 8.5* 8.2*   PT/INR  Recent Labs  03/05/16 2250  LABPROT 15.3*  INR 1.20     Recent Labs Lab 03/05/16 2250 03/06/16 1017  AST 30 19  ALT 25 20  ALKPHOS 38 31*  BILITOT 0.6 0.6  PROT 7.9 7.1  ALBUMIN 3.4* 3.1*     Lipase  No results found for: LIPASE   Studies/Results: No results found.  Prior to Admission medications   Medication Sig Start Date End Date Taking? Authorizing Provider  apixaban (ELIQUIS) 2.5 MG TABS tablet Take 2.5 mg by mouth 2 (two) times daily.   Yes Historical Provider, MD  cloNIDine (CATAPRES) 0.1 MG tablet Take 0.1 mg by mouth 2 (two) times daily.   Yes Historical Provider, MD  donepezil (ARICEPT) 10 MG tablet Take 10 mg by mouth at bedtime.   Yes Historical Provider, MD  ferrous sulfate 325 (65 FE) MG tablet Take 325 mg by mouth daily with  breakfast.   Yes Historical Provider, MD  levothyroxine (SYNTHROID, LEVOTHROID) 150 MCG tablet Take 150 mcg by mouth daily before breakfast.   Yes Historical Provider, MD  losartan (COZAAR) 100 MG tablet Take 100 mg by mouth daily.   Yes Historical Provider, MD  memantine (NAMENDA XR) 28 MG CP24 24 hr capsule Take 28 mg by mouth daily.   Yes Historical Provider, MD  metFORMIN (GLUCOPHAGE-XR) 500 MG 24 hr tablet Take 500 mg by mouth daily with breakfast.   Yes Historical Provider, MD  OVER THE COUNTER MEDICATION Take 2 tablets by mouth daily. Vitafusion Calcium gummy   Yes Historical Provider, MD  spironolactone (ALDACTONE) 25 MG tablet Take 25 mg by mouth 2 (two) times daily.   Yes Historical Provider, MD     Medications: . ciprofloxacin  400 mg Intravenous Q12H  . cloNIDine  0.2 mg Oral BID  . donepezil  10 mg Oral QHS  . insulin aspart  0-15 Units Subcutaneous Q4H  . levothyroxine  150 mcg Oral QAC breakfast  . losartan  100 mg Oral Daily  . mouth rinse  15 mL Mouth Rinse BID  . memantine  28 mg Oral Daily  . metronidazole  500 mg Intravenous Q8H  . spironolactone  25 mg Oral BID   . heparin 950 Units/hr (03/08/16 0600)  .  sodium chloride 0.9 % 1,000 mL with potassium chloride 40 mEq infusion 100 mL/hr at 03/08/16 0600   Assessment/Plan Ileal diverticulitis with contained perforation Dementia DM HTN Atrial fibrillation - Chronic anticoagulation - on Eliquis at home Fen: iv FLUIDS/NPO  => start sips and chips this AM, clears if she does well with that. ID:  Day 4 Cipro Flagyl DVT:  Heparin drip/SCD   Plan: She is afebrile, vital signs are stable, WBC is normal, she is without pain this a.m. We will plan to start some clear liquids today. Start mobilizing the patient in the room and the hallways.    LOS: 2 days    Merville Hijazi 03/08/2016 [phone]

## 2016-03-08 NOTE — Progress Notes (Signed)
Soft abdomen   Still confused  Start clears  WBC normal

## 2016-03-08 NOTE — Progress Notes (Addendum)
ANTICOAGULATION CONSULT NOTE - Follow-up Consult  Pharmacy Consult:  Heparin Indication: atrial fibrillation  Allergies  Allergen Reactions  . Promethazine Other (See Comments)    On MAR     Patient Measurements: Height: 5\' 6"  (167.6 cm) Weight: 125 lb 10.6 oz (57 kg) IBW/kg (Calculated) : 59.3 Heparin Dosing Weight: 57 kg  Vital Signs: Temp: 98.7 F (37.1 C) (11/06 0612) Temp Source: Oral (11/05 2156) BP: 135/75 (11/06 0612) Pulse Rate: 54 (11/06 0612)  Labs:  Recent Labs  03/05/16 2250 03/06/16 1017 03/07/16 0557 03/07/16 2153 03/08/16 0219 03/08/16 0634  HGB 11.2* 10.5* 10.1*  --  9.8*  --   HCT 33.8* 32.1* 31.4*  --  29.6*  --   PLT 227 199 184  --  204  --   APTT  --   --   --  61*  --  100*  LABPROT 15.3*  --   --   --   --   --   INR 1.20  --   --   --   --   --   HEPARINUNFRC  --   --   --  0.48  --  0.59  CREATININE 1.10* 0.95 0.95  --  0.95  --     Estimated Creatinine Clearance: 40.4 mL/min (by C-G formula based on SCr of 0.95 mg/dL).   Assessment: [age] YOF with history of AFib on Eliquis PTA, last dose 03/05/16.  Pharmacy consulted to transition patient to IV heparin while Eliquis is on hold for possible surgery. Heparin levels appear to be now correlating with aPTT. Therapeutic on 950 units/h.  ADDENDUM: Confirmatory heparin level slightly low (0.28) on 950 units/hr - per RN, drip has been off twice today, once ~1100 to 1150 and again from ~1610-9604~1425-1435. Will keep at same rate and re-time level. No issues with bleeding noted. CBC stable.  Goal of Therapy:  Heparin level 0.3-0.7 units/ml aPTT 66 - 102 seconds Monitor platelets by anticoagulation protocol: Yes   Plan:  - Heparin gtt at 950 units/hr - Check 8 hr heparin level from time drip was restarted - Daily heparin level/CBC - Monitor for s/sx bleeding  Babs BertinHaley Kadarrius Yanke, PharmD, BCPS Clinical Pharmacist Pager [phone] 03/08/2016 8:45 AM

## 2016-03-08 NOTE — Progress Notes (Signed)
PROGRESS NOTE    Heidi George  AVW:[id number] DOB: July 28, 1930 DOA: 03/05/2016 PCP: No primary care provider on file.    Brief Narrative:  Patient is a 82 year old female history of Alzheimer's dementia who is disoriented at baseline by recognizes family, hypertension, hypothyroidism, diabetes, atrial fibrillation presented to the ED with atypical behaviors and found to have a acute diverticulitis with contained perforation.   Assessment & Plan:   Principal Problem:   Diverticulitis of large intestine with perforation Active Problems:   Atrial fibrillation with controlled ventricular response (HCC)   Diverticulitis   HTN (hypertension)   Diabetes (HCC)   Hypothyroidism   Acute diverticulitis  #1 acute diverticulitis with contained perforation. Patient denies any abdominal pain, no tenderness noted on exam. Patient is afebrile. WBC trending down. Patient has been started on clear liquids and seems to be tolerating it. Continue empiric IV ciprofloxacin and IV Flagyl. General surgery following and appreciate input and recommendations.  #2 hypokalemia Repleted.  #3 hypertension Increased clonidine to 0.2 mg twice daily. Continue home regimen of Cozaar, spironolactone.  4 hypothyroidism Continue Synthroid.  #5 diabetes mellitus Hemoglobin A1c 7.0. CBGs have ranged from 120 -133. Sliding scale insulin.  #6 atrial fibrillation CHADS2VASC Score 5 Rate control. Eliquis on hold however will bridge with IV heparin until has improved clinically and no anticipation for surgical procedures.  #7 dementia Continue Aricept and Namenda     DVT prophylaxis: SCDs Code Status: Full Family Communication: No family at bedside. Disposition Plan: Back to facility once medically stable with improvement with diverticulitis and tolerating oral intake and per general surgery.   Consultants:   Gen. surgery: Dr. Donell Beers 03/06/2016  Procedures:   CT abdomen and pelvis 03/06/2016  Chest  x-ray 03/06/2016  Antimicrobials:  IV ciprofloxacin 03/06/2016  IV Flagyl 03/06/2016   Subjective: Patient sitting up in chair. Pleasantly confused. Patient denies any abdominal pain, no nausea no vomiting. Patient currently on clears and tolerated.  Objective: Vitals:   03/07/16 1255 03/07/16 2156 03/08/16 0612 03/08/16 1233  BP: (!) 178/69 (!) 170/63 135/75 (!) 160/62  Pulse: 61 82 (!) 54 (!) 57  Resp: 16 16 16 16   Temp: 98.4 F (36.9 C) 98.6 F (37 C) 98.7 F (37.1 C) 98.8 F (37.1 C)  TempSrc: Oral Oral  Oral  SpO2: 95% 96% 92% 91%  Weight:      Height:        Intake/Output Summary (Last 24 hours) at 03/08/16 1811 Last data filed at 03/08/16 1406  Gross per 24 hour  Intake           2615.5 ml  Output              950 ml  Net           1665.5 ml   Filed Weights   03/05/16 2150 03/06/16 0430  Weight: 68 kg (150 lb) 57 kg (125 lb 10.6 oz)    Examination:  General exam: Appears calm and comfortable  Respiratory system: Clear to auscultation. Respiratory effort normal. Cardiovascular system: S1 & S2 heard, RRR. No JVD, murmurs, rubs, gallops or clicks. No pedal edema. Gastrointestinal system: Abdomen is nondistended, soft and tender to palpation in the left lower quadrant. No organomegaly or masses felt. Normal bowel sounds heard. Central nervous system: Alert and oriented. No focal neurological deficits. Extremities: Symmetric 5 x 5 power. Skin: No rashes, lesions or ulcers Psychiatry: Judgement and insight poor. Mood & affect appropriate.     Data Reviewed: I have  personally reviewed following labs and imaging studies  CBC:  Recent Labs Lab 03/05/16 2250 03/06/16 1017 03/07/16 0557 03/08/16 0219  WBC 13.8* 11.3* 7.7 9.3  NEUTROABS  --  8.3*  --   --   HGB 11.2* 10.5* 10.1* 9.8*  HCT 33.8* 32.1* 31.4* 29.6*  MCV 93.1 93.6 95.7 93.7  PLT 227 199 184 204   Basic Metabolic Panel:  Recent Labs Lab 03/05/16 2250 03/06/16 1017 03/07/16 0557  03/08/16 0219  NA 136 139 139 138  K 4.2 3.7 3.3* 4.5  CL 105 110 107 112*  CO2 20* 23 25 21*  GLUCOSE 219* 142* 154* 112*  BUN 21* 15 12 12   CREATININE 1.10* 0.95 0.95 0.95  CALCIUM 9.4 8.8* 8.5* 8.2*  MG  --   --   --  1.4*   GFR: Estimated Creatinine Clearance: 40.4 mL/min (by C-G formula based on SCr of 0.95 mg/dL). Liver Function Tests:  Recent Labs Lab 03/05/16 2250 03/06/16 1017  AST 30 19  ALT 25 20  ALKPHOS 38 31*  BILITOT 0.6 0.6  PROT 7.9 7.1  ALBUMIN 3.4* 3.1*   No results for input(s): LIPASE, AMYLASE in the last 168 hours. No results for input(s): AMMONIA in the last 168 hours. Coagulation Profile:  Recent Labs Lab 03/05/16 2250  INR 1.20   Cardiac Enzymes: No results for input(s): CKTOTAL, CKMB, CKMBINDEX, TROPONINI in the last 168 hours. BNP (last 3 results) No results for input(s): PROBNP in the last 8760 hours. HbA1C:  Recent Labs  03/06/16 0440  HGBA1C 7.0*   CBG:  Recent Labs Lab 03/07/16 2355 03/08/16 0446 03/08/16 0749 03/08/16 1231 03/08/16 1638  GLUCAP 129* 101* 123* 120* 133*   Lipid Profile: No results for input(s): CHOL, HDL, LDLCALC, TRIG, CHOLHDL, LDLDIRECT in the last 72 hours. Thyroid Function Tests: No results for input(s): TSH, T4TOTAL, FREET4, T3FREE, THYROIDAB in the last 72 hours. Anemia Panel: No results for input(s): VITAMINB12, FOLATE, FERRITIN, TIBC, IRON, RETICCTPCT in the last 72 hours. Sepsis Labs:  Recent Labs Lab 03/06/16 0440 03/06/16 0707  LATICACIDVEN 1.6 2.1*    Recent Results (from the past 240 hour(s))  Urine culture     Status: None   Collection Time: 03/06/16 12:05 AM  Result Value Ref Range Status   Specimen Description URINE, CATHETERIZED  Final   Special Requests NONE  Final   Culture NO GROWTH  Final   Report Status 03/07/2016 FINAL  Final  Culture, blood (Routine X 2) w Reflex to ID Panel     Status: None (Preliminary result)   Collection Time: 03/06/16  4:41 AM  Result Value  Ref Range Status   Specimen Description BLOOD RIGHT ARM  Final   Special Requests BOTTLES DRAWN AEROBIC AND ANAEROBIC 10ML  Final   Culture NO GROWTH 2 DAYS  Final   Report Status PENDING  Incomplete  Culture, blood (Routine X 2) w Reflex to ID Panel     Status: None (Preliminary result)   Collection Time: 03/06/16  4:46 AM  Result Value Ref Range Status   Specimen Description BLOOD LEFT ARM  Final   Special Requests BOTTLES DRAWN AEROBIC AND ANAEROBIC 6ML  Final   Culture NO GROWTH 2 DAYS  Final   Report Status PENDING  Incomplete  MRSA PCR Screening     Status: None   Collection Time: 03/06/16  4:57 AM  Result Value Ref Range Status   MRSA by PCR NEGATIVE NEGATIVE Final    Comment:  The GeneXpert MRSA Assay (FDA approved for NASAL specimens only), is one component of a comprehensive MRSA colonization surveillance program. It is not intended to diagnose MRSA infection nor to guide or monitor treatment for MRSA infections.          Radiology Studies: No results found.      Scheduled Meds: . ciprofloxacin  400 mg Intravenous Q12H  . cloNIDine  0.2 mg Oral BID  . donepezil  10 mg Oral QHS  . insulin aspart  0-15 Units Subcutaneous Q4H  . levothyroxine  150 mcg Oral QAC breakfast  . losartan  100 mg Oral Daily  . memantine  28 mg Oral Daily  . metronidazole  500 mg Intravenous Q8H  . spironolactone  25 mg Oral BID   Continuous Infusions: . heparin 950 Units/hr (03/08/16 1435)  . sodium chloride 0.9 % 1,000 mL with potassium chloride 40 mEq infusion 100 mL/hr at 03/08/16 1435     LOS: 2 days    Time spent: 35 minutes    Senica Crall, MD [facility] Pager [phone]  If 7PM-7AM, please contact night-coverage [email or web address] Password [email or web address] 03/08/2016, 6:11 PM

## 2016-03-08 NOTE — Evaluation (Signed)
Physical Therapy Evaluation Patient Details Name: Heidi George MRN: [id number] DOB: 1931-02-28 Today's Date: 03/08/2016   History of Present Illness  Heidi AugustaSybil Scism is a 82 y.o. woman with a history of Alzheimer's dementia (she is disoriented at baseline but recognizes family; engages in "casual"conversation), HTN, hypothyroidism, DM, and atrial fibrillation (CHADS-Vasc score of 4, anticoagulated with Eliquis) who has had atypical behaviors within the 24 hours prior to presentation.  Because of her dementia, she cannot really articulate her symptoms, and she never really indicated or localized pain.  However, caregivers in her memory unit noticed increased weakness, decreased activity level, and apparent confusion (different from her baseline).  Pt with diverticulitis with perforation.  Afib.    Clinical Impression  Pt admitted with above diagnosis. Pt currently with functional limitations due to the deficits listed below (see PT Problem List). Pt slightly unsteady.  May need short [facility] if unsteady gait continues unless memory care can provide initial 24 hour care.  Unsure if pt may be at baseline.   Pt will benefit from skilled PT to increase their independence and safety with mobility to allow discharge to the venue listed below.      Follow Up Recommendations SNF;Supervision/Assistance - 24 hour    Equipment Recommendations  None recommended by PT    Recommendations for Other Services       Precautions / Restrictions Precautions Precautions: Fall Restrictions Weight Bearing Restrictions: No      Mobility  Bed Mobility Overal bed mobility: Independent                Transfers Overall transfer level: Independent                  Ambulation/Gait Ambulation/Gait assistance: Min assist;Min guard Ambulation Distance (Feet): 350 Feet Assistive device: None Gait Pattern/deviations: Decreased stride length;Step-through pattern;Narrow base  of support   Gait velocity interpretation: Below normal speed for age/gender General Gait Details: Pt on memory unit at baseline. Unsure what assist pt has.  Pt initially tried RW with pt but pt carrying RW.  Without RW, pt slightly unsteady as she was reaching for the rail at times and slightly unsteady. Will continue to progress pt as able.   Stairs            Wheelchair Mobility    Modified Rankin (Stroke Patients Only)       Balance Overall balance assessment: Needs assistance Sitting-balance support: No upper extremity supported;Feet supported Sitting balance-Leahy Scale: Good     Standing balance support: No upper extremity supported;During functional activity Standing balance-Leahy Scale: Poor Standing balance comment: Needed some stability static stance.             High level balance activites: Direction changes;Turns;Sudden stops High Level Balance Comments: Pt was able to ambulate with  min guard assist.              Pertinent Vitals/Pain Pain Assessment: No/denies pain  VSS    Home Living Family/patient expects to be discharged to:: Skilled nursing facility Living Arrangements: Other (Comment) (memory unit) Available Help at Discharge: Available 24 hours/day;Skilled Nursing Facility Type of Home: Skilled Nursing Facility Home Access: Level entry     Home Layout: One level Home Equipment: Other (comment) (unsure if pt has equipment, dementia and no family present)      Prior Function Level of Independence: Independent         Comments: Pt states her baseline she didnt use device.  Unsure  but chart says pt on memory unit.       Hand Dominance        Extremity/Trunk Assessment   Upper Extremity Assessment: Defer to OT evaluation           Lower Extremity Assessment: Overall WFL for tasks assessed      Cervical / Trunk Assessment: Normal  Communication   Communication: No difficulties  Cognition Arousal/Alertness:  Awake/alert Behavior During Therapy: Flat affect Overall Cognitive Status: History of cognitive impairments - at baseline       Memory: Decreased short-term memory;Decreased recall of precautions              General Comments      Exercises     Assessment/Plan    PT Assessment Patient needs continued PT services  PT Problem List Decreased activity tolerance;Decreased balance;Decreased mobility;Decreased knowledge of use of DME;Decreased safety awareness;Decreased knowledge of precautions;Decreased cognition          PT Treatment Interventions DME instruction;Gait training;Functional mobility training;Therapeutic activities;Therapeutic exercise;Balance training;Patient/family education    PT Goals (Current goals can be found in the Care Plan section)  Acute Rehab PT Goals Patient Stated Goal: unable to state PT Goal Formulation: Patient unable to participate in goal setting Time For Goal Achievement: 03/15/16 Potential to Achieve Goals: Good    Frequency Min 2X/week   Barriers to discharge        Co-evaluation               End of Session Equipment Utilized During Treatment: Gait belt Activity Tolerance: Patient limited by fatigue Patient left: in chair;with call bell/phone within reach;with chair alarm set Nurse Communication: Mobility status         Time: [id number] PT Time Calculation (min) (ACUTE ONLY): 30 min   Charges:   PT Evaluation $PT Eval Moderate Complexity: 1 Procedure PT Treatments $Gait Training: 8-22 mins   PT G CodesBerline Lopes:        Ruchama Kubicek F 03/08/2016, 4:23 PM Aamari Strawderman,PT Acute Rehabilitation [phone] (pager)

## 2016-03-09 LAB — CBC
HCT: 31.1 % — ABNORMAL LOW (ref 36.0–46.0)
Hemoglobin: 10.1 g/dL — ABNORMAL LOW (ref 12.0–15.0)
MCH: 30.7 pg (ref 26.0–34.0)
MCHC: 32.5 g/dL (ref 30.0–36.0)
MCV: 94.5 fL (ref 78.0–100.0)
PLATELETS: 227 10*3/uL (ref 150–400)
RBC: 3.29 MIL/uL — ABNORMAL LOW (ref 3.87–5.11)
RDW: 14.5 % (ref 11.5–15.5)
WBC: 8 10*3/uL (ref 4.0–10.5)

## 2016-03-09 LAB — BASIC METABOLIC PANEL
Anion gap: 10 (ref 5–15)
BUN: 8 mg/dL (ref 6–20)
CALCIUM: 8.6 mg/dL — AB (ref 8.9–10.3)
CHLORIDE: 108 mmol/L (ref 101–111)
CO2: 21 mmol/L — AB (ref 22–32)
CREATININE: 0.99 mg/dL (ref 0.44–1.00)
GFR calc Af Amer: 59 mL/min — ABNORMAL LOW (ref >60)
GFR calc non Af Amer: 51 mL/min — ABNORMAL LOW (ref >60)
GLUCOSE: 168 mg/dL — AB (ref 65–99)
Potassium: 3.7 mmol/L (ref 3.5–5.1)
Sodium: 139 mmol/L (ref 135–145)

## 2016-03-09 LAB — GLUCOSE, CAPILLARY
GLUCOSE-CAPILLARY: 160 mg/dL — AB (ref 65–99)
GLUCOSE-CAPILLARY: 141 mg/dL — AB (ref 65–99)
GLUCOSE-CAPILLARY: 145 mg/dL — AB (ref 65–99)
Glucose-Capillary: 117 mg/dL — ABNORMAL HIGH (ref 65–99)
Glucose-Capillary: 153 mg/dL — ABNORMAL HIGH (ref 65–99)
Glucose-Capillary: 120 mg/dL — ABNORMAL HIGH (ref 65–99)

## 2016-03-09 LAB — MAGNESIUM: Magnesium: 1.4 mg/dL — ABNORMAL LOW (ref 1.7–2.4)

## 2016-03-09 LAB — APTT: aPTT: 81 seconds — ABNORMAL HIGH (ref 24–36)

## 2016-03-09 LAB — HEPARIN LEVEL (UNFRACTIONATED): HEPARIN UNFRACTIONATED: 0.43 [IU]/mL (ref 0.30–0.70)

## 2016-03-09 MED ORDER — INSULIN ASPART 100 UNIT/ML ~~LOC~~ SOLN
0.0000 [IU] | Freq: Three times a day (TID) | SUBCUTANEOUS | Status: DC
  Administered 2016-03-09 – 2016-03-11 (×5): 2 [IU] via SUBCUTANEOUS
  Administered 2016-03-11: 3 [IU] via SUBCUTANEOUS
  Administered 2016-03-12: 2 [IU] via SUBCUTANEOUS

## 2016-03-09 MED ORDER — POTASSIUM CHLORIDE IN NACL 40-0.9 MEQ/L-% IV SOLN
INTRAVENOUS | Status: DC
  Administered 2016-03-09 – 2016-03-10 (×3): 100 mL/h via INTRAVENOUS
  Filled 2016-03-09 (×3): qty 1000

## 2016-03-09 MED ORDER — MAGNESIUM SULFATE 4 GM/100ML IV SOLN
4.0000 g | Freq: Once | INTRAVENOUS | Status: AC
  Administered 2016-03-09: 4 g via INTRAVENOUS
  Filled 2016-03-09: qty 100

## 2016-03-09 NOTE — Evaluation (Signed)
Occupational Therapy Evaluation Patient Details Name: Heidi AugustaSybil Corrales MRN: [id number] DOB: Jan 17, 1931 Today's Date: 03/09/2016    History of Present Illness Heidi AugustaSybil Tawil is a 82 y.o. woman with a history of Alzheimer's dementia (she is disoriented at baseline but recognizes family; engages in "casual"conversation), HTN, hypothyroidism, DM, and atrial fibrillation (CHADS-Vasc score of 4, anticoagulated with Eliquis) who has had atypical behaviors within the 24 hours prior to presentation.  Because of her dementia, she cannot really articulate her symptoms, and she never really indicated or localized pain.  However, caregivers in her memory unit noticed increased weakness, decreased activity level, and apparent confusion (different from her baseline).  Pt with diverticulitis with perforation.  Afib.     Clinical Impression   Pt with decline in function and safety with ADLs and ADL mobility. Pt with decreased strength, balance and endurance with hx of cognitive impairments and resides on a memory unit at an ALF. Uncertain of pt's PLOF or if used and AD/DME as pt is a poor historian. Pt would benefit from acute OT services to address impairments to increase level of function and safety    Follow Up Recommendations  SNF;Supervision/Assistance - 24 hour    Equipment Recommendations  None recommended by OT    Recommendations for Other Services       Precautions / Restrictions Precautions Precautions: Fall Restrictions Weight Bearing Restrictions: No      Mobility Bed Mobility Overal bed mobility: Needs Assistance Bed Mobility: Supine to Sit;Sit to Supine     Supine to sit: Supervision Sit to supine: Supervision      Transfers Overall transfer level: Needs assistance Equipment used: 1 person hand held assist Transfers: Sit to/from Stand Sit to Stand: Min guard              Balance Overall balance assessment: Needs assistance   Sitting balance-Leahy Scale: Good        Standing balance-Leahy Scale: Poor                              ADL Overall ADL's : Needs assistance/impaired     Grooming: Wash/dry hands;Wash/dry face;Min guard;Standing;Brushing hair   Upper Body Bathing: Set up;Sitting   Lower Body Bathing: Min guard;Minimal assistance;Sit to/from stand   Upper Body Dressing : Set up;Sitting   Lower Body Dressing: Sit to/from stand;Min guard;Minimal assistance   Toilet Transfer: Min guard;BSC;Ambulation   Toileting- Clothing Manipulation and Hygiene: Min guard;Sit to/from stand       Functional mobility during ADLs: Min guard General ADL Comments: pt with Poor standing balance during LB ADLs and grooming tasks     Vision Vision Assessment?: No apparent visual deficits              Pertinent Vitals/Pain Pain Assessment: No/denies pain     Hand Dominance Right   Extremity/Trunk Assessment Upper Extremity Assessment Upper Extremity Assessment: Generalized weakness   Lower Extremity Assessment Lower Extremity Assessment: Defer to PT evaluation   Cervical / Trunk Assessment Cervical / Trunk Assessment: Normal   Communication Communication Communication: No difficulties   Cognition Arousal/Alertness: Awake/alert Behavior During Therapy: Impulsive Overall Cognitive Status: History of cognitive impairments - at baseline       Memory: Decreased short-term memory;Decreased recall of precautions             General Comments   Pt pleasantly confused, cooperative  Home Living Family/patient expects to be discharged to:: Skilled nursing facility Living Arrangements: Other (Comment) (memory care unit) Available Help at Discharge: Available 24 hours/day;Skilled Nursing Facility Type of Home: Skilled Nursing Facility       Home Layout: One level     Bathroom Shower/Tub: Producer, television/film/videoWalk-in shower   Bathroom Toilet: Standard Bathroom Accessibility: No   Home Equipment: Other (comment)  (uncertain about DME or A/E, pt is a poor historian)          Prior Functioning/Environment Level of Independence: Independent        Comments: per pt report, howeevr pt is a Poor historian        OT Problem List: Decreased activity tolerance;Impaired balance (sitting and/or standing);Decreased cognition;Decreased knowledge of precautions;Decreased safety awareness;Decreased knowledge of use of DME or AE   OT Treatment/Interventions: Self-care/ADL training;DME and/or AE instruction;Therapeutic activities;Patient/family education    OT Goals(Current goals can be found in the care plan section) Acute Rehab OT Goals Patient Stated Goal: unable to state OT Goal Formulation: Patient unable to participate in goal setting Time For Goal Achievement: 03/16/16 Potential to Achieve Goals: Good ADL Goals Pt Will Perform Grooming: with supervision;with set-up;standing Pt Will Perform Lower Body Bathing: with supervision;with set-up;sit to/from stand Pt Will Perform Lower Body Dressing: with set-up;with supervision;sit to/from stand Pt Will Transfer to Toilet: with supervision;ambulating;regular height toilet;bedside commode;grab bars Pt Will Perform Toileting - Clothing Manipulation and hygiene: with supervision;sit to/from stand  OT Frequency: Min 2X/week   Barriers to D/C: Decreased caregiver support                        End of Session Equipment Utilized During Treatment: Gait belt  Activity Tolerance: Patient tolerated treatment well Patient left: in bed;with call bell/phone within reach;with bed alarm set   Time: [id number] OT Time Calculation (min): 25 min Charges:  OT General Charges $OT Visit: 1 Procedure OT Evaluation $OT Eval Moderate Complexity: 1 Procedure OT Treatments $Therapeutic Activity: 8-22 mins G-Codes:    Galen ManilaSpencer, Ladonne Sharples Jeanette 03/09/2016, 2:01 PM

## 2016-03-09 NOTE — Progress Notes (Signed)
PROGRESS NOTE    Heidi George  EXB:[id number] DOB: 1930/10/12 DOA: 03/05/2016 PCP: No primary care provider on file.    Brief Narrative:  Patient is a 82 year old female history of Alzheimer's dementia who is disoriented at baseline by recognizes family, hypertension, hypothyroidism, diabetes, atrial fibrillation presented to the ED with atypical behaviors and found to have a acute diverticulitis with contained perforation.   Assessment & Plan:   Principal Problem:   Diverticulitis of large intestine with perforation Active Problems:   Atrial fibrillation with controlled ventricular response (HCC)   Diverticulitis   HTN (hypertension)   Diabetes (HCC)   Hypothyroidism   Acute diverticulitis  #1 acute diverticulitis with contained perforation. Patient denies any abdominal pain, no tenderness noted on exam. Patient is afebrile. WBC trending down. Patient has been started on clear liquids and seems to be tolerating it. Diet has been advanced to full liquid diet per general surgery. Continue empiric IV ciprofloxacin and IV Flagyl. General surgery following and appreciate input and recommendations.  #2 hypokalemia Repleted.  #3 hypertension Increased clonidine to 0.2 mg twice daily. Continue home regimen of Cozaar, spironolactone.  4 hypothyroidism Continue Synthroid.  #5 diabetes mellitus Hemoglobin A1c 7.0. CBGs have ranged from 117 -153. Sliding scale insulin.  #6 atrial fibrillation CHADS2VASC Score 5 Rate control. Eliquis on hold, however bridged with IV heparin until improved clinically and no anticipation for surgical procedures.  #7 dementia Continue Aricept and Namenda     DVT prophylaxis: SCDs Code Status: Full Family Communication: No family at bedside. Disposition Plan: Back to facility once medically stable with improvement with diverticulitis and tolerating oral intake and per general surgery.   Consultants:   Gen. surgery: Dr. Donell BeersByerly  03/06/2016  Procedures:   CT abdomen and pelvis 03/06/2016  Chest x-ray 03/06/2016  Antimicrobials:  IV ciprofloxacin 03/06/2016  IV Flagyl 03/06/2016   Subjective: Patient laying in bed pleasantly confused. Patient denies any abdominal pain, no nausea no vomiting. Patient currently on full liquid diet.  Objective: Vitals:   03/08/16 1929 03/08/16 2252 03/09/16 0344 03/09/16 1250  BP: (!) 190/70 (!) 168/68 (!) 173/70 (!) 134/53  Pulse: 65 68 63 (!) 57  Resp: 19  19 18   Temp: 99.5 F (37.5 C)  99.1 F (37.3 C) 98 F (36.7 C)  TempSrc: Oral  Oral Oral  SpO2: 94%  95% 96%  Weight:      Height:        Intake/Output Summary (Last 24 hours) at 03/09/16 1557 Last data filed at 03/09/16 1350  Gross per 24 hour  Intake          2395.93 ml  Output              750 ml  Net          1645.93 ml   Filed Weights   03/05/16 2150 03/06/16 0430  Weight: 68 kg (150 lb) 57 kg (125 lb 10.6 oz)    Examination:  General exam: Appears calm and comfortable  Respiratory system: Clear to auscultation. Respiratory effort normal. Cardiovascular system: S1 & S2 heard, RRR. No JVD, murmurs, rubs, gallops or clicks. No pedal edema. Gastrointestinal system: Abdomen is nondistended, soft and tender to palpation in the left lower quadrant. No organomegaly or masses felt. Normal bowel sounds heard. Central nervous system: Alert and oriented. No focal neurological deficits. Extremities: Symmetric 5 x 5 power. Skin: No rashes, lesions or ulcers Psychiatry: Judgement and insight poor. Mood & affect appropriate.     Data  Reviewed: I have personally reviewed following labs and imaging studies  CBC:  Recent Labs Lab 03/05/16 2250 03/06/16 1017 03/07/16 0557 03/08/16 0219 03/09/16 0708  WBC 13.8* 11.3* 7.7 9.3 8.0  NEUTROABS  --  8.3*  --   --   --   HGB 11.2* 10.5* 10.1* 9.8* 10.1*  HCT 33.8* 32.1* 31.4* 29.6* 31.1*  MCV 93.1 93.6 95.7 93.7 94.5  PLT 227 199 184 204 227   Basic  Metabolic Panel:  Recent Labs Lab 03/05/16 2250 03/06/16 1017 03/07/16 0557 03/08/16 0219 03/09/16 0708  NA 136 139 139 138 139  K 4.2 3.7 3.3* 4.5 3.7  CL 105 110 107 112* 108  CO2 20* 23 25 21* 21*  GLUCOSE 219* 142* 154* 112* 168*  BUN 21* 15 12 12 8   CREATININE 1.10* 0.95 0.95 0.95 0.99  CALCIUM 9.4 8.8* 8.5* 8.2* 8.6*  MG  --   --   --  1.4* 1.4*   GFR: Estimated Creatinine Clearance: 38.7 mL/min (by C-G formula based on SCr of 0.99 mg/dL). Liver Function Tests:  Recent Labs Lab 03/05/16 2250 03/06/16 1017  AST 30 19  ALT 25 20  ALKPHOS 38 31*  BILITOT 0.6 0.6  PROT 7.9 7.1  ALBUMIN 3.4* 3.1*   No results for input(s): LIPASE, AMYLASE in the last 168 hours. No results for input(s): AMMONIA in the last 168 hours. Coagulation Profile:  Recent Labs Lab 03/05/16 2250  INR 1.20   Cardiac Enzymes: No results for input(s): CKTOTAL, CKMB, CKMBINDEX, TROPONINI in the last 168 hours. BNP (last 3 results) No results for input(s): PROBNP in the last 8760 hours. HbA1C: No results for input(s): HGBA1C in the last 72 hours. CBG:  Recent Labs Lab 03/08/16 1927 03/09/16 0109 03/09/16 0408 03/09/16 0749 03/09/16 1144  GLUCAP 113* 160* 117* 153* 141*   Lipid Profile: No results for input(s): CHOL, HDL, LDLCALC, TRIG, CHOLHDL, LDLDIRECT in the last 72 hours. Thyroid Function Tests: No results for input(s): TSH, T4TOTAL, FREET4, T3FREE, THYROIDAB in the last 72 hours. Anemia Panel: No results for input(s): VITAMINB12, FOLATE, FERRITIN, TIBC, IRON, RETICCTPCT in the last 72 hours. Sepsis Labs:  Recent Labs Lab 03/06/16 0440 03/06/16 0707  LATICACIDVEN 1.6 2.1*    Recent Results (from the past 240 hour(s))  Urine culture     Status: None   Collection Time: 03/06/16 12:05 AM  Result Value Ref Range Status   Specimen Description URINE, CATHETERIZED  Final   Special Requests NONE  Final   Culture NO GROWTH  Final   Report Status 03/07/2016 FINAL  Final   Culture, blood (Routine X 2) w Reflex to ID Panel     Status: None (Preliminary result)   Collection Time: 03/06/16  4:41 AM  Result Value Ref Range Status   Specimen Description BLOOD RIGHT ARM  Final   Special Requests BOTTLES DRAWN AEROBIC AND ANAEROBIC  Final   Culture NO GROWTH 3 DAYS  Final   Report Status PENDING  Incomplete  Culture, blood (Routine X 2) w Reflex to ID Panel     Status: None (Preliminary result)   Collection Time: 03/06/16  4:46 AM  Result Value Ref Range Status   Specimen Description BLOOD LEFT ARM  Final   Special Requests BOTTLES DRAWN AEROBIC AND ANAEROBIC  Final   Culture NO GROWTH 3 DAYS  Final   Report Status PENDING  Incomplete  MRSA PCR Screening     Status: None   Collection Time: 03/06/16  4:57 AM  Result Value Ref Range Status   MRSA by PCR NEGATIVE NEGATIVE Final    Comment:        The GeneXpert MRSA Assay (FDA approved for NASAL specimens only), is one component of a comprehensive MRSA colonization surveillance program. It is not intended to diagnose MRSA infection nor to guide or monitor treatment for MRSA infections.          Radiology Studies: No results found.      Scheduled Meds: . ciprofloxacin  400 mg Intravenous Q12H  . cloNIDine  0.2 mg Oral BID  . donepezil  10 mg Oral QHS  . insulin aspart  0-15 Units Subcutaneous TID WC  . levothyroxine  150 mcg Oral QAC breakfast  . losartan  100 mg Oral Daily  . memantine  28 mg Oral Daily  . metronidazole  500 mg Intravenous Q8H  . spironolactone  25 mg Oral BID   Continuous Infusions: . 0.9 % NaCl with KCl 40 mEq / L 100 mL/hr (03/09/16 0424)  . heparin 950 Units/hr (03/09/16 1203)     LOS: 3 days    Time spent: 35 minutes    Loral Campi, MD [facility] Pager [phone]  If 7PM-7AM, please contact night-coverage [email or web address] Password [email or web address] 03/09/2016, 3:57 PM

## 2016-03-09 NOTE — Progress Notes (Signed)
ANTICOAGULATION CONSULT NOTE - Follow-up Consult  Pharmacy Consult:  Heparin Indication: atrial fibrillation  Allergies  Allergen Reactions  . Promethazine Other (See Comments)    On MAR     Patient Measurements: Height: 5\' 6"  (167.6 cm) Weight: 125 lb 10.6 oz (57 kg) IBW/kg (Calculated) : 59.3 Heparin Dosing Weight: 57 kg  Vital Signs: Temp: 99.1 F (37.3 C) (11/07 0344) Temp Source: Oral (11/07 0344) BP: 173/70 (11/07 0344) Pulse Rate: 63 (11/07 0344)  Labs:  Recent Labs  03/07/16 0557  03/07/16 2153 03/08/16 0219 03/08/16 0634 03/08/16 1438 03/08/16 2206 03/09/16 0708  HGB 10.1*  --   --  9.8*  --   --   --  10.1*  HCT 31.4*  --   --  29.6*  --   --   --  31.1*  PLT 184  --   --  204  --   --   --  227  APTT  --   --  61*  --  100*  --   --  81*  HEPARINUNFRC  --   < > 0.48  --  0.59 0.28* 0.52 0.43  CREATININE 0.95  --   --  0.95  --   --   --  0.99  < > = values in this interval not displayed.  Estimated Creatinine Clearance: 38.7 mL/min (by C-G formula based on SCr of 0.99 mg/dL).   Assessment: [age] YOF with history of AFib on Eliquis PTA, last dose 03/05/16.  Pharmacy consulted to transition patient to IV heparin while Eliquis is on hold for possible surgery. Following heparin levels only now that correlating with aPTT. Remains therapeutic (heparin level 0.43) on 950 units/h. CBC stable, no bleed documented.   Goal of Therapy:  Heparin level 0.3-0.7 units/ml aPTT 66 - 102 seconds Monitor platelets by anticoagulation protocol: Yes   Plan:  - Heparin gtt at 950 units/hr - Daily heparin level/CBC - Monitor for s/sx bleeding - Eliquis on hold  Babs BertinHaley Samil Mecham, PharmD, [facility] Clinical Pharmacist Pager [phone] 03/09/2016 9:16 AM

## 2016-03-09 NOTE — Progress Notes (Signed)
[facility] Progress Note     Subjective: Pleasantly confused - oriented to person, not place or time.  Denies pain.  Objective: Vital signs in last 24 hours: Temp:  [98.8 F (37.1 C)-99.5 F (37.5 C)] 99.1 F (37.3 C) (11/07 0344) Pulse Rate:  [57-68] 63 (11/07 0344) Resp:  [16-19] 19 (11/07 0344) BP: (160-190)/(62-70) 173/70 (11/07 0344) SpO2:  [91 %-95 %] 95 % (11/07 0344) Last BM Date:  (PTA)  Intake/Output from previous day: 11/06 0701 - 11/07 0700 In: 2519.9 [P.O.:300; I.V.:2119.9; IV Piggyback:100] Out: -  Intake/Output this shift: Total I/O In: -  Out: 750 [Urine:750]  PE: General appearance: alert, cooperative and in no acute distress GI: soft, mild tenderness to deep palpation of central/left lower abdomen, mild distention; +BS; no masses, hernias, or organomegaly  Lab Results:   Recent Labs  03/08/16 0219 03/09/16 0708  WBC 9.3 8.0  HGB 9.8* 10.1*  HCT 29.6* 31.1*  PLT 204 227   BMET  Recent Labs  03/08/16 0219 03/09/16 0708  NA 138 139  K 4.5 3.7  CL 112* 108  CO2 21* 21*  GLUCOSE 112* 168*  BUN 12 8  CREATININE 0.95 0.99  CALCIUM 8.2* 8.6*   CMP     Component Value Date/Time   NA 139 03/09/2016 0708   K 3.7 03/09/2016 0708   CL 108 03/09/2016 0708   CO2 21 (L) 03/09/2016 0708   GLUCOSE 168 (H) 03/09/2016 0708   BUN 8 03/09/2016 0708   CREATININE 0.99 03/09/2016 0708   CALCIUM 8.6 (L) 03/09/2016 0708   PROT 7.1 03/06/2016 1017   ALBUMIN 3.1 (L) 03/06/2016 1017   AST 19 03/06/2016 1017   ALT 20 03/06/2016 1017   ALKPHOS 31 (L) 03/06/2016 1017   BILITOT 0.6 03/06/2016 1017   GFRNONAA 51 (L) 03/09/2016 0708   GFRAA 59 (L) 03/09/2016 0708   Anti-infectives: Anti-infectives    Start     Dose/Rate Route Frequency Ordered Stop   03/06/16 0500  metroNIDAZOLE (FLAGYL) IVPB 500 mg     500 mg 100 mL/hr over 60 Minutes Intravenous Every 8 hours 03/06/16 0431     03/06/16 0500  ciprofloxacin (CIPRO) IVPB 400 mg     400  mg 200 mL/hr over 60 Minutes Intravenous Every 12 hours 03/06/16 0423     03/06/16 0345  ciprofloxacin (CIPRO) tablet 500 mg  Status:  Discontinued     500 mg Oral  Once 03/06/16 0334 03/06/16 0343   03/06/16 0345  metroNIDAZOLE (FLAGYL) tablet 500 mg  Status:  Discontinued     500 mg Oral  Once 03/06/16 0334 03/06/16 0343   03/06/16 0345  ciprofloxacin (CIPRO) IVPB 400 mg  Status:  Discontinued     400 mg 200 mL/hr over 60 Minutes Intravenous  Once 03/06/16 0343 03/06/16 0410   03/06/16 0345  metroNIDAZOLE (FLAGYL) IVPB 500 mg  Status:  Discontinued     500 mg 100 mL/hr over 60 Minutes Intravenous  Once 03/06/16 0343 03/06/16 0410     Assessment/Plan Ileal diverticulitis with contained perforation - WBC WNL - Tolerated clear liquid diet   Dementia DM HTN Atrial fibrillation - Chronic anticoagulation - on Eliquis at home; heparin per pharmacy  FEN: full liquid diet ID:  Day 5 Cipro Flagyl DVT:  Heparin drip/SCD   Plan: advance to full liquid diet Switch to PO Augmentin tomorrow    LOS: 3 days    Adam PhenixElizabeth S Lavert Matousek , [facility] 03/09/2016, 11:08 AM  Pager: [phone] Consults: [phone] Mon-Fri 7:00 am-4:30 pm Sat-Sun 7:00 am-11:30 am

## 2016-03-10 DIAGNOSIS — K5720 Diverticulitis of large intestine with perforation and abscess without bleeding: Secondary | ICD-10-CM

## 2016-03-10 LAB — BASIC METABOLIC PANEL
ANION GAP: 8 (ref 5–15)
BUN: 9 mg/dL (ref 6–20)
CO2: 19 mmol/L — ABNORMAL LOW (ref 22–32)
Calcium: 8.4 mg/dL — ABNORMAL LOW (ref 8.9–10.3)
Chloride: 110 mmol/L (ref 101–111)
Creatinine, Ser: 1.05 mg/dL — ABNORMAL HIGH (ref 0.44–1.00)
GFR calc Af Amer: 55 mL/min — ABNORMAL LOW (ref >60)
GFR, EST NON AFRICAN AMERICAN: 48 mL/min — AB (ref >60)
Glucose, Bld: 173 mg/dL — ABNORMAL HIGH (ref 65–99)
POTASSIUM: 4.7 mmol/L (ref 3.5–5.1)
SODIUM: 137 mmol/L (ref 135–145)

## 2016-03-10 LAB — GLUCOSE, CAPILLARY
GLUCOSE-CAPILLARY: 146 mg/dL — AB (ref 65–99)
GLUCOSE-CAPILLARY: 143 mg/dL — AB (ref 65–99)
Glucose-Capillary: 116 mg/dL — ABNORMAL HIGH (ref 65–99)
Glucose-Capillary: 130 mg/dL — ABNORMAL HIGH (ref 65–99)

## 2016-03-10 LAB — CBC
HEMATOCRIT: 31.4 % — AB (ref 36.0–46.0)
HEMOGLOBIN: 10.2 g/dL — AB (ref 12.0–15.0)
MCH: 30.7 pg (ref 26.0–34.0)
MCHC: 32.5 g/dL (ref 30.0–36.0)
MCV: 94.6 fL (ref 78.0–100.0)
Platelets: 230 10*3/uL (ref 150–400)
RBC: 3.32 MIL/uL — ABNORMAL LOW (ref 3.87–5.11)
RDW: 14.8 % (ref 11.5–15.5)
WBC: 10.2 10*3/uL (ref 4.0–10.5)

## 2016-03-10 LAB — HEPARIN LEVEL (UNFRACTIONATED): Heparin Unfractionated: 0.39 IU/mL (ref 0.30–0.70)

## 2016-03-10 LAB — MAGNESIUM: MAGNESIUM: 1.7 mg/dL (ref 1.7–2.4)

## 2016-03-10 MED ORDER — AMOXICILLIN-POT CLAVULANATE 875-125 MG PO TABS
1.0000 | ORAL_TABLET | Freq: Two times a day (BID) | ORAL | Status: DC
  Administered 2016-03-10 – 2016-03-12 (×5): 1 via ORAL
  Filled 2016-03-10 (×6): qty 1

## 2016-03-10 NOTE — Progress Notes (Signed)
SW reached out to son due to pt not being alert and oriented. Son states that he would like to speak with PT or a physician regarding the pt about if they feel she would need to be referred to a higher level of care. Son was agitated via phone and states he wanted to know exactly what type of therapy the pt will need.   SW paged PT. SW awaiting a call back.   [facility] who states that if the pt should be able to return to facility if she needs PT just for safety and mobility. SW will continue to follow up.  Crista CurbBrittney Bladyn Tipps, MSW

## 2016-03-10 NOTE — Progress Notes (Addendum)
ANTICOAGULATION CONSULT NOTE - Follow-up Consult  Pharmacy Consult:  Heparin Indication: atrial fibrillation  Allergies  Allergen Reactions  . Promethazine Other (See Comments)    On MAR     Patient Measurements: Height: 5\' 6"  (167.6 cm) Weight: 125 lb 10.6 oz (57 kg) IBW/kg (Calculated) : 59.3 Heparin Dosing Weight: 57 kg  Vital Signs: Temp: (P) 97.1 F (36.2 C) (11/08 0444) Temp Source: (P) Oral (11/08 0444) BP: (P) 122/71 (11/08 0444) Pulse Rate: (P) 61 (11/08 0444)  Labs:  Recent Labs  03/07/16 2153 03/08/16 0219 03/08/16 0634 03/08/16 1438 03/08/16 2206 03/09/16 0708  HGB  --  9.8*  --   --   --  10.1*  HCT  --  29.6*  --   --   --  31.1*  PLT  --  204  --   --   --  227  APTT 61*  --  100*  --   --  81*  HEPARINUNFRC 0.48  --  0.59 0.28* 0.52 0.43  CREATININE  --  0.95  --   --   --  0.99    Estimated Creatinine Clearance: 38.7 mL/min (by C-G formula based on SCr of 0.99 mg/dL).   Assessment: [age] YOF with history of AFib on Eliquis PTA, last dose 03/05/16. Pharmacy consulted to transition patient to IV heparin while Eliquis is on hold for possible surgery. Following heparin levels only now that correlating with aPTT. Remains therapeutic (heparin level 0.39) on 950 units/h. CBC stable, no bleed documented.   Goal of Therapy:  Heparin level 0.3-0.7 units/ml Monitor platelets by anticoagulation protocol: Yes   Plan:  - Heparin gtt at 950 units/hr - Daily heparin level/CBC - Monitor for s/sx bleeding - Eliquis on hold  Babs BertinHaley Cadan Maggart, PharmD, [facility] LLCBCPS Clinical Pharmacist Pager [phone] 03/10/2016 9:31 AM

## 2016-03-10 NOTE — Progress Notes (Signed)
Patient ID: Heidi George, female   DOB: 1930/07/31, 82 y.o.   MRN: [id number]    PROGRESS NOTE    Heidi AugustaSybil Javier  ION:[id number] DOB: 1930/07/31 DOA: 03/05/2016  PCP: No primary care provider on file.   Brief Narrative:  82 year old female history of Alzheimer's dementia who is disoriented at baseline but recognizes family, hypertension, hypothyroidism, diabetes, atrial fibrillation presented to the ED with atypical behaviors and found to have acute diverticulitis with contained perforation.  Assessment & Plan: Acute diverticulitis with contained perforation - appears to be asymptomatic at this time, no abd pain, no nausea or vomiting - poor appetite  - surgery OK with advancing diet to soft to see if pt would eat more  - change IV ABX to PO Augmentin   Hypokalemia, hypomagnesemia  - supplemented and WNl this AM - check Mg and BMP in AM  Hypertension, essential  - Increased clonidine to 0.2 mg twice daily - Continue home regimen of Cozaar, spironolactone - SBP still in 170's this AM   Hypothyroidism - Continue Synthroid  Diabetes mellitus - Hemoglobin A1c 7.0 - keep on SSI   Atrial fibrillation CHADS2VASC Score 5 - Rate control. Eliquis on hold, however bridged with IV heparin until improved clinically and no anticipation for surgical procedures.  Dementia - Continue Aricept and Namenda - appears to be stable at this time   DVT prophylaxis: SCDs Code Status: Full Family Communication: No family at bedside. Spoke with so over the phone.  Disposition Plan: Likely SNF, [facility]    Consultants:   Gen. surgery: Dr. Donell BeersByerly 03/06/2016  Procedures:   CT abdomen and pelvis 03/06/2016  Chest x-ray 03/06/2016  Antimicrobials:  IV ciprofloxacin 03/06/2016 --> 11/08  IV Flagyl 03/06/2016 --> 11/08  Augmentin 11/08 -->  Subjective: No events overnight.   Objective: Vitals:   03/09/16 1250 03/09/16 2110 03/10/16 0444 03/10/16 1033  BP: (!)  134/53 130/62 122/71 (!) 175/62  Pulse: (!) 57 (!) 58 61 (!) 56  Resp: 18  18   Temp: 98 F (36.7 C)  97.1 F (36.2 C)   TempSrc: Oral  Oral   SpO2: 96%  97%   Weight:      Height:        Intake/Output Summary (Last 24 hours) at 03/10/16 1145 Last data filed at 03/10/16 0843  Gross per 24 hour  Intake          3029.33 ml  Output              400 ml  Net          2629.33 ml   Filed Weights   03/05/16 2150 03/06/16 0430  Weight: 68 kg (150 lb) 57 kg (125 lb 10.6 oz)    Examination:  General exam: Appears calm and comfortable  Respiratory system: Respiratory effort normal. Cardiovascular system: S1 & S2 heard, No JVD, murmurs, rubs, gallops or clicks. No pedal edema. Gastrointestinal system: Abdomen is nondistended, soft and nontender. No organomegaly or masses felt. Normal bowel sounds heard. Central nervous system: Alert and oriented. No focal neurological deficits.  Data Reviewed: I have personally reviewed following labs and imaging studies  CBC:  Recent Labs Lab 03/06/16 1017 03/07/16 0557 03/08/16 0219 03/09/16 0708 03/10/16 0940  WBC 11.3* 7.7 9.3 8.0 10.2  NEUTROABS 8.3*  --   --   --   --   HGB 10.5* 10.1* 9.8* 10.1* 10.2*  HCT 32.1* 31.4* 29.6* 31.1* 31.4*  MCV 93.6 95.7 93.7 94.5 94.6  PLT 199 184 204 227 230   Basic Metabolic Panel:  Recent Labs Lab 03/06/16 1017 03/07/16 0557 03/08/16 0219 03/09/16 0708 03/10/16 0940  NA 139 139 138 139 137  K 3.7 3.3* 4.5 3.7 4.7  CL 110 107 112* 108 110  CO2 23 25 21* 21* 19*  GLUCOSE 142* 154* 112* 168* 173*  BUN 15 12 12 8 9   CREATININE 0.95 0.95 0.95 0.99 1.05*  CALCIUM 8.8* 8.5* 8.2* 8.6* 8.4*  MG  --   --  1.4* 1.4* 1.7   Liver Function Tests:  Recent Labs Lab 03/05/16 2250 03/06/16 1017  AST 30 19  ALT 25 20  ALKPHOS 38 31*  BILITOT 0.6 0.6  PROT 7.9 7.1  ALBUMIN 3.4* 3.1*   Coagulation Profile:  Recent Labs Lab 03/05/16 2250  INR 1.20   CBG:  Recent Labs Lab 03/09/16 0749  03/09/16 1144 03/09/16 1712 03/09/16 2159 03/10/16 0736  GLUCAP 153* 141* 145* 120* 146*   Urine analysis:    Component Value Date/Time   COLORURINE YELLOW 03/06/2016 0005   APPEARANCEUR CLEAR 03/06/2016 0005   LABSPEC 1.013 03/06/2016 0005   PHURINE 5.5 03/06/2016 0005   GLUCOSEU NEGATIVE 03/06/2016 0005   HGBUR NEGATIVE 03/06/2016 0005   BILIRUBINUR NEGATIVE 03/06/2016 0005   KETONESUR NEGATIVE 03/06/2016 0005   PROTEINUR 30 (A) 03/06/2016 0005   NITRITE NEGATIVE 03/06/2016 0005   LEUKOCYTESUR NEGATIVE 03/06/2016 0005    Recent Results (from the past 240 hour(s))  Urine culture     Status: None   Collection Time: 03/06/16 12:05 AM  Result Value Ref Range Status   Specimen Description URINE, CATHETERIZED  Final   Special Requests NONE  Final   Culture NO GROWTH  Final   Report Status 03/07/2016 FINAL  Final  Culture, blood (Routine X 2) w Reflex to ID Panel     Status: None (Preliminary result)   Collection Time: 03/06/16  4:41 AM  Result Value Ref Range Status   Specimen Description BLOOD RIGHT ARM  Final   Special Requests BOTTLES DRAWN AEROBIC AND ANAEROBIC  Final   Culture NO GROWTH 4 DAYS  Final   Report Status PENDING  Incomplete  Culture, blood (Routine X 2) w Reflex to ID Panel     Status: None (Preliminary result)   Collection Time: 03/06/16  4:46 AM  Result Value Ref Range Status   Specimen Description BLOOD LEFT ARM  Final   Special Requests BOTTLES DRAWN AEROBIC AND ANAEROBIC  Final   Culture NO GROWTH 4 DAYS  Final   Report Status PENDING  Incomplete  MRSA PCR Screening     Status: None   Collection Time: 03/06/16  4:57 AM  Result Value Ref Range Status   MRSA by PCR NEGATIVE NEGATIVE Final     Radiology Studies: No results found.  Scheduled Meds: . amoxicillin-clavulanate  1 tablet Oral Q12H  . cloNIDine  0.2 mg Oral BID  . donepezil  10 mg Oral QHS  . insulin aspart  0-15 Units Subcutaneous TID WC  . levothyroxine  150 mcg Oral QAC  breakfast  . losartan  100 mg Oral Daily  . memantine  28 mg Oral Daily  . spironolactone  25 mg Oral BID   Continuous Infusions: . 0.9 % NaCl with KCl 40 mEq / L 100 mL/hr (03/10/16 1030)  . heparin 950 Units/hr (03/09/16 1203)     LOS: 4 days   Time spent: 20 minutes   MAGICK-Jahree Dermody, Sherlon Handing, MD  [facility] Pager [phone]  If 7PM-7AM, please contact night-coverage [email or web address] Password [email or web address] 03/10/2016, 11:45 AM

## 2016-03-10 NOTE — Progress Notes (Signed)
  Subjective: Denies abdominal pain Tolerating full liquids  Objective: Vital signs in last 24 hours: Temp:  [97.1 F (36.2 C)-98 F (36.7 C)] (P) 97.1 F (36.2 C) (11/08 0444) Pulse Rate:  [57-61] (P) 61 (11/08 0444) Resp:  [18] (P) 18 (11/08 0444) BP: (130-134)/(53-62) (P) 122/71 (11/08 0444) SpO2:  [96 %-97 %] (P) 97 % (11/08 0444) Last BM Date:  (PTA)  Intake/Output from previous day: 11/07 0701 - 11/08 0700 In: 2789.3 [P.O.:240; I.V.:2449.3; IV Piggyback:100] Out: 1150 [Urine:1150] Intake/Output this shift: Total I/O In: 240 [P.O.:240] Out: -   Exam: Awake and alert Appears comfortable Abdomen soft with minimal to no tenderness  Lab Results:   Recent Labs  03/08/16 0219 03/09/16 0708  WBC 9.3 8.0  HGB 9.8* 10.1*  HCT 29.6* 31.1*  PLT 204 227   BMET  Recent Labs  03/08/16 0219 03/09/16 0708  NA 138 139  K 4.5 3.7  CL 112* 108  CO2 21* 21*  GLUCOSE 112* 168*  BUN 12 8  CREATININE 0.95 0.99  CALCIUM 8.2* 8.6*   PT/INR No results for input(s): LABPROT, INR in the last 72 hours. ABG No results for input(s): PHART, HCO3 in the last 72 hours.  Invalid input(s): PCO2, PO2  Studies/Results: No results found.  Anti-infectives: Anti-infectives    Start     Dose/Rate Route Frequency Ordered Stop   03/06/16 0500  metroNIDAZOLE (FLAGYL) IVPB 500 mg     500 mg 100 mL/hr over 60 Minutes Intravenous Every 8 hours 03/06/16 0431     03/06/16 0500  ciprofloxacin (CIPRO) IVPB 400 mg     400 mg 200 mL/hr over 60 Minutes Intravenous Every 12 hours 03/06/16 0423     03/06/16 0345  ciprofloxacin (CIPRO) tablet 500 mg  Status:  Discontinued     500 mg Oral  Once 03/06/16 0334 03/06/16 0343   03/06/16 0345  metroNIDAZOLE (FLAGYL) tablet 500 mg  Status:  Discontinued     500 mg Oral  Once 03/06/16 0334 03/06/16 0343   03/06/16 0345  ciprofloxacin (CIPRO) IVPB 400 mg  Status:  Discontinued     400 mg 200 mL/hr over 60 Minutes Intravenous  Once 03/06/16 0343  03/06/16 0410   03/06/16 0345  metroNIDAZOLE (FLAGYL) IVPB 500 mg  Status:  Discontinued     500 mg 100 mL/hr over 60 Minutes Intravenous  Once 03/06/16 0343 03/06/16 0410      Assessment/Plan: Ileal diverticulitis with contained perforation - WBC WNL  Try soft diet Start po Augmentin  Saryna Kneeland A 03/10/2016

## 2016-03-10 NOTE — Care Management Important Message (Signed)
Important Message  Patient Details  Name: Heidi George MRN: [id number] Date of Birth: 02/05/1931   Medicare Important Message Given:  Yes    Heidi George 03/10/2016, 11:32 AM

## 2016-03-11 LAB — BASIC METABOLIC PANEL
ANION GAP: 7 (ref 5–15)
BUN: 10 mg/dL (ref 6–20)
CHLORIDE: 107 mmol/L (ref 101–111)
CO2: 22 mmol/L (ref 22–32)
Calcium: 8.7 mg/dL — ABNORMAL LOW (ref 8.9–10.3)
Creatinine, Ser: 1.02 mg/dL — ABNORMAL HIGH (ref 0.44–1.00)
GFR calc Af Amer: 57 mL/min — ABNORMAL LOW (ref >60)
GFR, EST NON AFRICAN AMERICAN: 49 mL/min — AB (ref >60)
GLUCOSE: 162 mg/dL — AB (ref 65–99)
POTASSIUM: 4.2 mmol/L (ref 3.5–5.1)
Sodium: 136 mmol/L (ref 135–145)

## 2016-03-11 LAB — CBC
HEMATOCRIT: 31.6 % — AB (ref 36.0–46.0)
HEMOGLOBIN: 10.1 g/dL — AB (ref 12.0–15.0)
MCH: 30.3 pg (ref 26.0–34.0)
MCHC: 32 g/dL (ref 30.0–36.0)
MCV: 94.9 fL (ref 78.0–100.0)
Platelets: 245 10*3/uL (ref 150–400)
RBC: 3.33 MIL/uL — AB (ref 3.87–5.11)
RDW: 14.7 % (ref 11.5–15.5)
WBC: 10.7 10*3/uL — ABNORMAL HIGH (ref 4.0–10.5)

## 2016-03-11 LAB — CULTURE, BLOOD (ROUTINE X 2)
CULTURE: NO GROWTH
Culture: NO GROWTH

## 2016-03-11 LAB — GLUCOSE, CAPILLARY
GLUCOSE-CAPILLARY: 136 mg/dL — AB (ref 65–99)
Glucose-Capillary: 152 mg/dL — ABNORMAL HIGH (ref 65–99)
Glucose-Capillary: 126 mg/dL — ABNORMAL HIGH (ref 65–99)
Glucose-Capillary: 121 mg/dL — ABNORMAL HIGH (ref 65–99)

## 2016-03-11 LAB — HEPARIN LEVEL (UNFRACTIONATED): HEPARIN UNFRACTIONATED: 0.37 [IU]/mL (ref 0.30–0.70)

## 2016-03-11 NOTE — Progress Notes (Signed)
ANTICOAGULATION CONSULT NOTE - Follow-up Consult  Pharmacy Consult:  Heparin Indication: atrial fibrillation  Allergies  Allergen Reactions  . Promethazine Other (See Comments)    On MAR     Patient Measurements: Height: 5\' 6"  (167.6 cm) Weight: 125 lb 10.6 oz (57 kg) IBW/kg (Calculated) : 59.3 Heparin Dosing Weight: 57 kg  Vital Signs: Temp: 97.6 F (36.4 C) (11/09 0452) Temp Source: Oral (11/09 0452) BP: 139/61 (11/09 0452) Pulse Rate: 53 (11/09 0452)  Labs:  Recent Labs  03/09/16 0708 03/10/16 0940 03/11/16 0306  HGB 10.1* 10.2* 10.1*  HCT 31.1* 31.4* 31.6*  PLT 227 230 245  APTT 81*  --   --   HEPARINUNFRC 0.43 0.39 0.37  CREATININE 0.99 1.05* 1.02*    Estimated Creatinine Clearance: 37.6 mL/min (by C-G formula based on SCr of 1.02 mg/dL (H)).   Assessment: Heidi George with history of AFib on Eliquis PTA, last dose 03/05/16. Pharmacy consulted to transition patient to IV heparin while Eliquis is on hold for possible surgery. Following heparin levels only now that correlating with aPTT. Remains therapeutic (heparin level 0.37) on 950 units/h. CBC stable, no bleed documented.   Goal of Therapy:  Heparin level 0.3-0.7 units/ml Monitor platelets by anticoagulation protocol: Yes   Plan:  - Heparin gtt at 950 units/hr - Daily heparin level/CBC - Monitor for s/sx bleeding - Eliquis on hold  Babs BertinHaley Stanley Lyness, PharmD, [facility] Clinical Pharmacist Pager [phone] 03/11/2016 9:06 AM

## 2016-03-11 NOTE — Progress Notes (Signed)
Physical Therapy Treatment Patient Details Name: Heidi George MRN: [id number] DOB: 1931/03/13 Today's Date: 03/11/2016    History of Present Illness Heidi AugustaSybil George is a 82 y.o. woman with a history of Alzheimer's dementia (she is disoriented at baseline but recognizes family; engages in "casual"conversation), HTN, hypothyroidism, DM, and atrial fibrillation (CHADS-Vasc score of 4, anticoagulated with Eliquis) who has had atypical behaviors within the 24 hours prior to presentation.  Because of her dementia, she cannot really articulate her symptoms, and she never really indicated or localized pain.  However, caregivers in her memory unit noticed increased weakness, decreased activity level, and apparent confusion (different from her baseline).  Pt with diverticulitis with perforation.  Afib.      PT Comments    Pt admitted with above diagnosis. Pt currently with functional limitations due to balance deficits as well as long term dementia limiting mobility. Pt continues to be unsteady with gait especially with challenges.  Called A living at [facility] and spoke with Lynnda Childonna Brendle.  Lupita LeashDonna gave insight as to pts prior level of function and pt does sound like she is more unsteady than prior to admit but sounds to be close to baseline as she was unsteady at times PTA.  Lupita LeashDonna informed this PT that pt could get HHPT and HHOT 5x week on return to A living and that pt's family could buy alarms for pt to use on bed and chair to alert staff when pt is getting up until pt does return to baseline.  Feel that with this, pt can d/c to A living as PTA to return to her usual environment and will most likely return to baseline at a quicker pace.  Lupita LeashDonna in agreement.  Melanee SpryIan, director is apparently visiting pt today to determine if pt is able [facility].  Called DR. Myers to ensure pt will be able to take po antibiotics and pt will per Dr. Izola PriceMyers.  She also states that pt could be ready for d/c as early  as tomorrow if she is eating better.  Son updated as to plans and he is on board to buy alarms if needed.  Will follow up as needed and continue progression to assist transition back to A Living.    Pt will benefit from skilled PT to increase their independence and safety with mobility to allow discharge to the venue listed below.    Follow Up Recommendations  Home health PT;Supervision - Intermittent (Alarms for safety), HHOT     Equipment Recommendations  None recommended by PT    Recommendations for Other Services       Precautions / Restrictions Precautions Precautions: Fall Restrictions Weight Bearing Restrictions: No    Mobility  Bed Mobility Overal bed mobility: Needs Assistance Bed Mobility: Supine to Sit;Sit to Supine     Supine to sit: Supervision Sit to supine: Supervision   General bed mobility comments: Cues to come to EOB but no assistance given.   Transfers Overall transfer level: Needs assistance Equipment used: None Transfers: Sit to/from Stand Sit to Stand: Min guard         General transfer comment: Had to guard pt for transitional movement as she is unsteady at times if distracted.  Once pt stood, noted that Depends was wet with urine.  Pt unaware.  Went into bathroom and pt was unable to take Depends off without help.  Also needed help to get Depends over feet.  Assisted pt with cleaning herself as well.  Pt was  also unaware that she had had a bowel movement after sitting on toilet.   Ambulation/Gait Ambulation/Gait assistance: Min assist Ambulation Distance (Feet): 450 Feet Assistive device: None Gait Pattern/deviations: Step-through pattern;Decreased stride length   Gait velocity interpretation: <1.8 ft/sec, indicative of risk for recurrent falls General Gait Details:  Pt slightly unsteady as she was reaching for the rail at times and slightly unsteady without challenges given.  however when pt challenged, lost balance and needed min assist to  recover.   Will continue to progress pt as able.    Stairs            Wheelchair Mobility    Modified Rankin (Stroke Patients Only)       Balance Overall balance assessment: Needs assistance Sitting-balance support: No upper extremity supported;Feet supported Sitting balance-Leahy Scale: Good     Standing balance support: No upper extremity supported;During functional activity Standing balance-Leahy Scale: Poor Standing balance comment: Pt could stand statically without assist.  Pulled Depends up and washed hands at sink.               High level balance activites: Direction changes;Turns;Sudden stops;Backward walking;Head turns High Level Balance Comments: Pt needed min assist to recover with challenges.  Losing balance all directions with head turns and challenges.     Cognition Arousal/Alertness: Awake/alert Behavior During Therapy: Impulsive Overall Cognitive Status: History of cognitive impairments - at baseline       Memory: Decreased short-term memory;Decreased recall of precautions              Exercises      General Comments General comments (skin integrity, edema, etc.): [employer]Called Facility and son.  See clinical impression for conversations.        Pertinent Vitals/Pain Pain Assessment: No/denies pain  VSS    Home Living                      Prior Function            PT Goals (current goals can now be found in the care plan section) Acute Rehab PT Goals Patient Stated Goal: unable to state Progress towards PT goals: Progressing toward goals    Frequency    Min 3X/week      PT Plan Discharge plan needs to be updated    Co-evaluation             End of Session Equipment Utilized During Treatment: Gait belt Activity Tolerance: Patient tolerated treatment well Patient left: in chair;with call bell/phone within reach;with chair alarm set     Time: [id number] PT Time Calculation (min) (ACUTE ONLY): 24  min  Charges:  $Gait Training: 8-22 mins $Therapeutic Activity: 8-22 mins                    G CodesTawni Millers:      Latrease Kunde F 03/11/2016, 12:28 PM Caitland Porchia,PT Acute Rehabilitation [phone] (pager)

## 2016-03-11 NOTE — Progress Notes (Signed)
Patient ID: Heidi George, female   DOB: Nov 11, 1930, 82 y.o.   MRN: [id number]    PROGRESS NOTE    Heidi George  WUJ:[id number] DOB: Nov 11, 1930 DOA: 03/05/2016  PCP: No primary care provider on file.   Brief Narrative:  82 year old female history of Alzheimer's dementia who is disoriented at baseline but recognizes family, hypertension, hypothyroidism, diabetes, atrial fibrillation presented to the ED with atypical behaviors and found to have acute diverticulitis with contained perforation.  Assessment & Plan: Acute diverticulitis with contained perforation - appears to be asymptomatic at this time, no abd pain, no nausea or vomiting - poor appetite, ate < 10% of her breakfast  - WBC is up a just a bit since yesterday but no fevers overnight, repeat CBC in AM  - keep on soft diet for now  - continue Augmentin   Hypokalemia, hypomagnesemia  - supplemented and WNl this AM - check Mg and BMP in AM  Hypertension, essential  - Increased clonidine to 0.2 mg twice daily - Continue home regimen of Cozaar, spironolactone - reasonable inpatient control in the past 24 hours   Hypothyroidism - Continue Synthroid  Diabetes mellitus - Hemoglobin A1c 7.0 - keep on SSI   Atrial fibrillation CHADS2VASC Score 5 - Rate control. Eliquis on hold, however bridged with IV heparin until improved clinically and no anticipation for surgical procedures.  Dementia - Continue Aricept and Namenda - appears to be stable at this time   DVT prophylaxis: SCDs Code Status: Full Family Communication: No family at bedside. Spoke with so over the phone.  Disposition Plan: Likely SNF, [facility]    Consultants:   Gen. surgery: Dr. Donell BeersByerly 03/06/2016  Procedures:   CT abdomen and pelvis 03/06/2016  Chest x-ray 03/06/2016  Antimicrobials:  IV ciprofloxacin 03/06/2016 --> 11/08  IV Flagyl 03/06/2016 --> 11/08  Augmentin 11/08 -->  Subjective: No events overnight.    Objective: Vitals:   03/10/16 1033 03/10/16 1320 03/10/16 2134 03/11/16 0452  BP: (!) 175/62 (!) 153/57 (!) 160/58 139/61  Pulse: (!) 56 (!) 53 (!) 56 (!) 53  Resp:   18 17  Temp:  99 F (37.2 C) 99.1 F (37.3 C) 97.6 F (36.4 C)  TempSrc:  Oral Oral Oral  SpO2:  95% 94% 96%  Weight:      Height:        Intake/Output Summary (Last 24 hours) at 03/11/16 1009 Last data filed at 03/11/16 0453  Gross per 24 hour  Intake              636 ml  Output                0 ml  Net              636 ml   Filed Weights   03/05/16 2150 03/06/16 0430  Weight: 68 kg (150 lb) 57 kg (125 lb 10.6 oz)    Examination:  General exam: Appears calm and comfortable  Respiratory system: Respiratory effort normal. Cardiovascular system: S1 & S2 heard, No JVD, murmurs, rubs, gallops or clicks. No pedal edema. Gastrointestinal system: Abdomen is nondistended, soft and nontender. No organomegaly or masses felt. Normal bowel sounds heard. Central nervous system: Alert and oriented. No focal neurological deficits.  Data Reviewed: I have personally reviewed following labs and imaging studies  CBC:  Recent Labs Lab 03/06/16 1017 03/07/16 0557 03/08/16 0219 03/09/16 0708 03/10/16 0940 03/11/16 0306  WBC 11.3* 7.7 9.3 8.0 10.2 10.7*  NEUTROABS 8.3*  --   --   --   --   --  HGB 10.5* 10.1* 9.8* 10.1* 10.2* 10.1*  HCT 32.1* 31.4* 29.6* 31.1* 31.4* 31.6*  MCV 93.6 95.7 93.7 94.5 94.6 94.9  PLT 199 184 204 227 230 245   Basic Metabolic Panel:  Recent Labs Lab 03/07/16 0557 03/08/16 0219 03/09/16 0708 03/10/16 0940 03/11/16 0306  NA 139 138 139 137 136  K 3.3* 4.5 3.7 4.7 4.2  CL 107 112* 108 110 107  CO2 25 21* 21* 19* 22  GLUCOSE 154* 112* 168* 173* 162*  BUN 12 12 8 9 10   CREATININE 0.95 0.95 0.99 1.05* 1.02*  CALCIUM 8.5* 8.2* 8.6* 8.4* 8.7*  MG  --  1.4* 1.4* 1.7  --    Liver Function Tests:  Recent Labs Lab 03/05/16 2250 03/06/16 1017  AST 30 19  ALT 25 20  ALKPHOS 38  31*  BILITOT 0.6 0.6  PROT 7.9 7.1  ALBUMIN 3.4* 3.1*   Coagulation Profile:  Recent Labs Lab 03/05/16 2250  INR 1.20   CBG:  Recent Labs Lab 03/10/16 0736 03/10/16 1145 03/10/16 1728 03/10/16 2136 03/11/16 0748  GLUCAP 146* 116* 143* 130* 136*   Urine analysis:    Component Value Date/Time   COLORURINE YELLOW 03/06/2016 0005   APPEARANCEUR CLEAR 03/06/2016 0005   LABSPEC 1.013 03/06/2016 0005   PHURINE 5.5 03/06/2016 0005   GLUCOSEU NEGATIVE 03/06/2016 0005   HGBUR NEGATIVE 03/06/2016 0005   BILIRUBINUR NEGATIVE 03/06/2016 0005   KETONESUR NEGATIVE 03/06/2016 0005   PROTEINUR 30 (A) 03/06/2016 0005   NITRITE NEGATIVE 03/06/2016 0005   LEUKOCYTESUR NEGATIVE 03/06/2016 0005    Recent Results (from the past 240 hour(s))  Urine culture     Status: None   Collection Time: 03/06/16 12:05 AM  Result Value Ref Range Status   Specimen Description URINE, CATHETERIZED  Final   Special Requests NONE  Final   Culture NO GROWTH  Final   Report Status 03/07/2016 FINAL  Final  Culture, blood (Routine X 2) w Reflex to ID Panel     Status: None (Preliminary result)   Collection Time: 03/06/16  4:41 AM  Result Value Ref Range Status   Specimen Description BLOOD RIGHT ARM  Final   Special Requests BOTTLES DRAWN AEROBIC AND ANAEROBIC 10ML  Final   Culture NO GROWTH 4 DAYS  Final   Report Status PENDING  Incomplete  Culture, blood (Routine X 2) w Reflex to ID Panel     Status: None (Preliminary result)   Collection Time: 03/06/16  4:46 AM  Result Value Ref Range Status   Specimen Description BLOOD LEFT ARM  Final   Special Requests BOTTLES DRAWN AEROBIC AND ANAEROBIC 6ML  Final   Culture NO GROWTH 4 DAYS  Final   Report Status PENDING  Incomplete  MRSA PCR Screening     Status: None   Collection Time: 03/06/16  4:57 AM  Result Value Ref Range Status   MRSA by PCR NEGATIVE NEGATIVE Final     Radiology Studies: No results found.  Scheduled Meds: .  amoxicillin-clavulanate  1 tablet Oral Q12H  . cloNIDine  0.2 mg Oral BID  . donepezil  10 mg Oral QHS  . insulin aspart  0-15 Units Subcutaneous TID WC  . levothyroxine  150 mcg Oral QAC breakfast  . losartan  100 mg Oral Daily  . memantine  28 mg Oral Daily  . spironolactone  25 mg Oral BID   Continuous Infusions: . heparin 950 Units/hr (03/10/16 1430)     LOS: 5 days  Time spent: 20 minutes   Debbora Presto, MD [facility] Pager [phone]  If 7PM-7AM, please contact night-coverage [email or web address] Password [email or web address] 03/11/2016, 10:09 AM

## 2016-03-11 NOTE — Progress Notes (Signed)
  Subjective: No complaints of abdominal pain.  Objective: Vital signs in last 24 hours: Temp:  [97.6 F (36.4 C)-99.1 F (37.3 C)] 97.6 F (36.4 C) (11/09 0452) Pulse Rate:  [53-56] 53 (11/09 0452) Resp:  [17-18] 17 (11/09 0452) BP: (139-160)/(57-61) 139/61 (11/09 0452) SpO2:  [94 %-96 %] 96 % (11/09 0452) Last BM Date: 03/10/16 800PO Voided 5 No BM recorded Afebrile vital signs are stable Creatinine stable at 1.2 WBC 10.7, up from 10.2 yesterday.  Intake/Output from previous day: 11/08 0701 - 11/09 0700 In: 876 [P.O.:800; I.V.:76] Out: -  Intake/Output this shift: No intake/output data recorded.  General appearance: alert, cooperative, no distress and Continued issues with memory. GI: soft, non-tender; bowel sounds normal; no masses,  no organomegaly  Lab Results:   Recent Labs  03/10/16 0940 03/11/16 0306  WBC 10.2 10.7*  HGB 10.2* 10.1*  HCT 31.4* 31.6*  PLT 230 245    BMET  Recent Labs  03/10/16 0940 03/11/16 0306  NA 137 136  K 4.7 4.2  CL 110 107  CO2 19* 22  GLUCOSE 173* 162*  BUN 9 10  CREATININE 1.05* 1.02*  CALCIUM 8.4* 8.7*   PT/INR No results for input(s): LABPROT, INR in the last 72 hours.   Recent Labs Lab 03/05/16 2250 03/06/16 1017  AST 30 19  ALT 25 20  ALKPHOS 38 31*  BILITOT 0.6 0.6  PROT 7.9 7.1  ALBUMIN 3.4* 3.1*     Lipase  No results found for: LIPASE   Studies/Results: No results found.  Medications: . amoxicillin-clavulanate  1 tablet Oral Q12H  . cloNIDine  0.2 mg Oral BID  . donepezil  10 mg Oral QHS  . insulin aspart  0-15 Units Subcutaneous TID WC  . levothyroxine  150 mcg Oral QAC breakfast  . losartan  100 mg Oral Daily  . memantine  28 mg Oral Daily  . spironolactone  25 mg Oral BID   . heparin 950 Units/hr (03/10/16 1430)   Assessment/Plan Ileal diverticulitis with contained perforation Dementia DM HTN Atrial fibrillation - Chronic anticoagulation - on Eliquis at home; heparin per  pharmacy FEN: soft diet ID: Day 5 Cipro Flagyl completed 11/7, started on Augmentin day 2 DVT: Heparin drip/SCD   Plan: Agree with Dr. Izola PriceMyers. WBC is trending up although very minimally. Continue current treatment and recheck labs in a.m. She will need a total of 14 days of antibiotic treatment.  LOS: 5 days    Heidi George 03/11/2016 [phone]

## 2016-03-12 LAB — HEPARIN LEVEL (UNFRACTIONATED): Heparin Unfractionated: 0.26 IU/mL — ABNORMAL LOW (ref 0.30–0.70)

## 2016-03-12 LAB — URINALYSIS, ROUTINE W REFLEX MICROSCOPIC
Bilirubin Urine: NEGATIVE
GLUCOSE, UA: NEGATIVE mg/dL
HGB URINE DIPSTICK: NEGATIVE
Ketones, ur: NEGATIVE mg/dL
Leukocytes, UA: NEGATIVE
Nitrite: NEGATIVE
PROTEIN: NEGATIVE mg/dL
SPECIFIC GRAVITY, URINE: 1.009 (ref 1.005–1.030)
pH: 6 (ref 5.0–8.0)

## 2016-03-12 LAB — BASIC METABOLIC PANEL
ANION GAP: 10 (ref 5–15)
BUN: 13 mg/dL (ref 6–20)
CO2: 21 mmol/L — AB (ref 22–32)
Calcium: 9.1 mg/dL (ref 8.9–10.3)
Chloride: 107 mmol/L (ref 101–111)
Creatinine, Ser: 0.93 mg/dL (ref 0.44–1.00)
GFR calc Af Amer: >60 mL/min (ref >60)
GFR calc non Af Amer: 55 mL/min — ABNORMAL LOW (ref >60)
GLUCOSE: 118 mg/dL — AB (ref 65–99)
POTASSIUM: 4 mmol/L (ref 3.5–5.1)
Sodium: 138 mmol/L (ref 135–145)

## 2016-03-12 LAB — CBC
HCT: 32.2 % — ABNORMAL LOW (ref 36.0–46.0)
HEMOGLOBIN: 10.5 g/dL — AB (ref 12.0–15.0)
MCH: 30.7 pg (ref 26.0–34.0)
MCHC: 32.6 g/dL (ref 30.0–36.0)
MCV: 94.2 fL (ref 78.0–100.0)
Platelets: 275 10*3/uL (ref 150–400)
RBC: 3.42 MIL/uL — AB (ref 3.87–5.11)
RDW: 14.5 % (ref 11.5–15.5)
WBC: 10.8 10*3/uL — ABNORMAL HIGH (ref 4.0–10.5)

## 2016-03-12 LAB — GLUCOSE, CAPILLARY
GLUCOSE-CAPILLARY: 138 mg/dL — AB (ref 65–99)
Glucose-Capillary: 110 mg/dL — ABNORMAL HIGH (ref 65–99)
Glucose-Capillary: 128 mg/dL — ABNORMAL HIGH (ref 65–99)

## 2016-03-12 MED ORDER — AMOXICILLIN-POT CLAVULANATE 875-125 MG PO TABS: 1.0000 | ORAL_TABLET | Freq: Two times a day (BID) | ORAL | 0 refills | Status: AC

## 2016-03-12 MED ORDER — APIXABAN 2.5 MG PO TABS
2.5000 mg | ORAL_TABLET | Freq: Two times a day (BID) | ORAL | Status: DC
  Administered 2016-03-12: 2.5 mg via ORAL
  Filled 2016-03-12: qty 1

## 2016-03-12 NOTE — Progress Notes (Signed)
aVS papers given to daughter Dondra Spry(Gail). Prescription given to daughter Dondra Spry(Gail). Pt.'s d/c'd successfully with daughter. Daughter, Dondra SpryGail, will be [facility].

## 2016-03-12 NOTE — Discharge Summary (Signed)
Physician Discharge Summary  Solon AugustaSybil Loken ZOX:[id number] DOB: 1930/08/07 DOA: 03/05/2016  PCP: No primary care provider on file.  Admit date: 03/05/2016 Discharge date: 03/12/2016  Recommendations for Outpatient Follow-up:  1. Pt will need to follow up with PCP in 2-3 weeks post discharge 2. Please obtain BMP to evaluate electrolytes and kidney function 3. Please also check CBC to evaluate Hg and Hct levels 4. Please note that pt needs to complete therapy with Augmentin for 2 weeks  5. Please also note if bradycardia presents in the future as we have seen few episodes here, consider stopping Donepezil   Discharge Diagnoses:  Principal Problem:   Diverticulitis of large intestine with perforation Active Problems:   Atrial fibrillation with controlled ventricular response (HCC)   Diverticulitis   HTN (hypertension)  Discharge Condition: Stable  Diet recommendation: Heart healthy diet discussed in details   Brief Narrative:  82 year old female history of Alzheimer's dementia who is disoriented at baseline but recognizes family, hypertension, hypothyroidism, diabetes, atrial fibrillation presented to the ED with atypical behaviors and found to have acute diverticulitis with contained perforation.  Assessment & Plan: Acute diverticulitis with contained perforation - appears to be asymptomatic at this time, no abd pain, no nausea or vomiting - improved appetite  - WBC is up a just a bit since yesterday but no fevers overnight - keep on soft diet for now as pt tolerating well  - continue Augmentin for two more weeks  - surgery cleared for discharge   Hypokalemia, hypomagnesemia  - supplemented and WNl this AM  Hypertension, essential  - Continue home regimen  - reasonable inpatient control in the past 24 hours   Hypothyroidism - Continue Synthroid  Diabetes mellitus - Hemoglobin A1c 7.0 - resume metformin   Atrial fibrillation CHADS2VASC Score 5 - resumed  Eliquis  - few episodes of asymptomatic bradycardia, consider stopping Donepezil if this become recurrent problem   Dementia - Continue Aricept and Namenda - appears to be stable at this time   Code Status:Full Family Communication:Daughter over the phone  Disposition Plan:SNF   Consultants:  Gen. surgery: Dr. Donell BeersByerly 03/06/2016  Procedures:  CT abdomen and pelvis 03/06/2016  Chest x-ray 03/06/2016  Antimicrobials:  IV ciprofloxacin 03/06/2016 --> 11/08  IV Flagyl 03/06/2016 --> 11/08  Augmentin 11/08 -->  Procedures/Studies: Dg Chest 2 View  Result Date: 03/06/2016 CLINICAL DATA:  Fever for 1 day. EXAM: CHEST  2 VIEW COMPARISON:  None. FINDINGS: No airspace consolidation. No effusions. Mild interstitial prominence may represent interstitial infiltrate or interstitial fluid. Mild cardiomegaly. IMPRESSION: Mild interstitial fluid or thickening. This could represent a degree of congestive heart failure. No airspace consolidation or effusion Electronically Signed   By: Ellery Plunkaniel R Mitchell M.D.   On: 03/06/2016 00:52   Ct Abdomen Pelvis W Contrast  Result Date: 03/06/2016 CLINICAL DATA:  Acute onset of fever.  Initial encounter. EXAM: CT ABDOMEN AND PELVIS WITH CONTRAST TECHNIQUE: Multidetector CT imaging of the abdomen and pelvis was performed using the standard protocol following bolus administration of intravenous contrast. CONTRAST:  100mL ISOVUE-300 IOPAMIDOL (ISOVUE-300) INJECTION 61% COMPARISON:  CT of the abdomen and pelvis from 06/04/2011 FINDINGS: Lower chest: Mild bibasilar peripheral opacities may reflect mild fibrotic change and atelectasis. Hepatobiliary: The liver is unremarkable in appearance. The patient is status post cholecystectomy, with clips noted at the gallbladder fossa. The common bile duct remains normal in caliber. Pancreas: The pancreas is within normal limits. Spleen: The spleen is unremarkable in appearance. Adrenals/Urinary Tract: The adrenal  glands  are unremarkable in appearance. The kidneys are within normal limits. There is no evidence of hydronephrosis. No renal or ureteral stones are identified. Nonspecific perinephric stranding is noted bilaterally. Stomach/Bowel: The stomach is unremarkable in appearance. Scattered diverticulosis is noted along the proximal to mid ileum at the left side of the abdomen, with focal mesenteric inflammation tracking proximally, and associated small focus of fluid and air adjacent to the bowel measuring 1.8 cm, raising concern for mild contained perforation extending into the mesentery. A prominent duodenal diverticulum is noted at the pancreatic head. The appendix is not visualized; there is no evidence for appendicitis. Mild diverticulosis is noted along the sigmoid colon. Vascular/Lymphatic: Scattered calcification is seen along the abdominal aorta and its branches. The abdominal aorta is otherwise grossly unremarkable. The inferior vena cava is grossly unremarkable. No retroperitoneal lymphadenopathy is seen. No pelvic sidewall lymphadenopathy is identified. Reproductive: The bladder is mildly distended and grossly unremarkable. The patient is status post hysterectomy. No suspicious adnexal masses are seen. Other: No additional soft tissue abnormalities are seen. Musculoskeletal: No acute osseous abnormalities are identified. Vacuum phenomenon is noted at L5-S1. The visualized musculature is unremarkable in appearance. IMPRESSION: 1. Acute diverticulitis at the proximal to mid ileum at the left side of the abdomen, with focal mesenteric inflammation tracking proximally. Small focus of fluid and air adjacent to the bowel measures 1.8 cm, concerning for mild contained perforation extending into the mesentery. 2. Scattered diverticulosis along the proximal to mid ileum. Prominent duodenal diverticulum also noted at the pancreatic head. Mild diverticulosis along the sigmoid colon. 3. Mild bibasilar peripheral opacities  may reflect mild fibrotic change and atelectasis. 4. Scattered aortic atherosclerosis. These results were called by telephone at the time of interpretation on 03/06/2016 at 2:47 am to Dr. Tomasita CrumbleADELEKE ONI, who verbally acknowledged these results. Electronically Signed   By: Roanna RaiderJeffery  Chang M.D.   On: 03/06/2016 02:49     Discharge Exam: Vitals:   03/11/16 2104 03/12/16 0516  BP: (!) 158/55 (!) 138/46  Pulse:  (!) 48  Resp:  18  Temp:  98.2 F (36.8 C)   Vitals:   03/11/16 1330 03/11/16 1943 03/11/16 2104 03/12/16 0516  BP: (!) 162/60 (!) 158/55 (!) 158/55 (!) 138/46  Pulse: (!) 53 (!) 51  (!) 48  Resp:  18  18  Temp: 98.3 F (36.8 C) 98 F (36.7 C)  98.2 F (36.8 C)  TempSrc: Oral Oral  Oral  SpO2: 96% 96%  95%  Weight:      Height:        General: Pt is alert, follows commands appropriately, not in acute distress Cardiovascular: Regular rate and rhythm, S1/S2 +, no murmurs, no rubs, no gallops Respiratory: Clear to auscultation bilaterally, no wheezing, no crackles, no rhonchi Abdominal: Soft, non tender, non distended, bowel sounds +, no guarding  Discharge Instructions  Discharge Instructions    Diet - low sodium heart healthy    Complete by:  As directed    Increase activity slowly    Complete by:  As directed        Medication List    TAKE these medications   amoxicillin-clavulanate 875-125 MG tablet Commonly known as:  AUGMENTIN Take 1 tablet by mouth every 12 (twelve) hours.   cloNIDine 0.1 MG tablet Commonly known as:  CATAPRES Take 0.1 mg by mouth 2 (two) times daily.   donepezil 10 MG tablet Commonly known as:  ARICEPT Take 10 mg by mouth at bedtime.   ELIQUIS 2.5 MG  Tabs tablet Generic drug:  apixaban Take 2.5 mg by mouth 2 (two) times daily.   ferrous sulfate 325 (65 FE) MG tablet Take 325 mg by mouth daily with breakfast.   levothyroxine 150 MCG tablet Commonly known as:  SYNTHROID, LEVOTHROID Take 150 mcg by mouth daily before breakfast.    losartan 100 MG tablet Commonly known as:  COZAAR Take 100 mg by mouth daily.   metFORMIN 500 MG 24 hr tablet Commonly known as:  GLUCOPHAGE-XR Take 500 mg by mouth daily with breakfast.   NAMENDA XR 28 MG Cp24 24 hr capsule Generic drug:  memantine Take 28 mg by mouth daily.   OVER THE COUNTER MEDICATION Take 2 tablets by mouth daily. Vitafusion Calcium gummy   spironolactone 25 MG tablet Commonly known as:  ALDACTONE Take 25 mg by mouth 2 (two) times daily.      Follow-up Information    [facility], Sherlon Handing, MD Follow up.   Specialty:  Internal Medicine Contact information: [address] [place] [address] [phone]            The results of significant diagnostics from this hospitalization (including imaging, microbiology, ancillary and laboratory) are listed below for reference.     Microbiology: Recent Results (from the past 240 hour(s))  Urine culture     Status: None   Collection Time: 03/06/16 12:05 AM  Result Value Ref Range Status   Specimen Description URINE, CATHETERIZED  Final   Special Requests NONE  Final   Culture NO GROWTH  Final   Report Status 03/07/2016 FINAL  Final  Culture, blood (Routine X 2) w Reflex to ID Panel     Status: None   Collection Time: 03/06/16  4:41 AM  Result Value Ref Range Status   Specimen Description BLOOD RIGHT ARM  Final   Special Requests BOTTLES DRAWN AEROBIC AND ANAEROBIC  Final   Culture NO GROWTH 5 DAYS  Final   Report Status 03/11/2016 FINAL  Final  Culture, blood (Routine X 2) w Reflex to ID Panel     Status: None   Collection Time: 03/06/16  4:46 AM  Result Value Ref Range Status   Specimen Description BLOOD LEFT ARM  Final   Special Requests BOTTLES DRAWN AEROBIC AND ANAEROBIC  Final   Culture NO GROWTH 5 DAYS  Final   Report Status 03/11/2016 FINAL  Final  MRSA PCR Screening     Status: None   Collection Time: 03/06/16  4:57 AM  Result Value Ref Range Status   MRSA  by PCR NEGATIVE NEGATIVE Final    Comment:        The GeneXpert MRSA Assay (FDA approved for NASAL specimens only), is one component of a comprehensive MRSA colonization surveillance program. It is not intended to diagnose MRSA infection nor to guide or monitor treatment for MRSA infections.      Labs: Basic Metabolic Panel:  Recent Labs Lab 03/08/16 0219 03/09/16 0708 03/10/16 0940 03/11/16 0306 03/12/16 0345  NA 138 139 137 136 138  K 4.5 3.7 4.7 4.2 4.0  CL 112* 108 110 107 107  CO2 21* 21* 19* 22 21*  GLUCOSE 112* 168* 173* 162* 118*  BUN 12 8 9 10 13   CREATININE 0.95 0.99 1.05* 1.02* 0.93  CALCIUM 8.2* 8.6* 8.4* 8.7* 9.1  MG 1.4* 1.4* 1.7  --   --    Liver Function Tests:  Recent Labs Lab 03/05/16 2250 03/06/16 1017  AST 30 19  ALT 25 20  ALKPHOS 38 31*  BILITOT 0.6 0.6  PROT 7.9 7.1  ALBUMIN 3.4* 3.1*   CBC:  Recent Labs Lab 03/06/16 1017  03/08/16 0219 03/09/16 0708 03/10/16 0940 03/11/16 0306 03/12/16 0345  WBC 11.3*  < > 9.3 8.0 10.2 10.7* 10.8*  NEUTROABS 8.3*  --   --   --   --   --   --   HGB 10.5*  < > 9.8* 10.1* 10.2* 10.1* 10.5*  HCT 32.1*  < > 29.6* 31.1* 31.4* 31.6* 32.2*  MCV 93.6  < > 93.7 94.5 94.6 94.9 94.2  PLT 199  < > 204 227 230 245 275  < > = values in this interval not displayed.  CBG:  Recent Labs Lab 03/11/16 0748 03/11/16 1213 03/11/16 1618 03/11/16 2157 03/12/16 0735  GLUCAP 136* 152* 126* 121* 110*    SIGNED: Time coordinating discharge: 30 minutes  MAGICK-Mayer Vondrak, MD  [facility] 03/12/2016, 10:22 AM Pager [phone]  If 7PM-7AM, please contact night-coverage [email or web address] Password [email or web address]

## 2016-03-12 NOTE — Progress Notes (Addendum)
ANTICOAGULATION CONSULT NOTE - Follow-up Consult  Pharmacy Consult:  Heparin Indication: atrial fibrillation  Allergies  Allergen Reactions  . Promethazine Other (See Comments)    On MAR     Patient Measurements: Height: 5\' 6"  (167.6 cm) Weight: 125 lb 10.6 oz (57 kg) IBW/kg (Calculated) : 59.3 Heparin Dosing Weight: 57 kg  Vital Signs: Temp: 98.2 F (36.8 C) (11/10 0516) Temp Source: Oral (11/10 0516) BP: 138/46 (11/10 0516) Pulse Rate: 48 (11/10 0516)  Labs:  Recent Labs  03/10/16 0940 03/11/16 0306 03/12/16 0345  HGB 10.2* 10.1* 10.5*  HCT 31.4* 31.6* 32.2*  PLT 230 245 275  HEPARINUNFRC 0.39 0.37 0.26*  CREATININE 1.05* 1.02* 0.93    Estimated Creatinine Clearance: 41.2 mL/min (by C-G formula based on SCr of 0.93 mg/dL).   Assessment: [age] YOF with history of AFib on Eliquis PTA, last dose 03/05/16. Pharmacy consulted to transition patient to IV heparin while Eliquis is on hold for possible surgery. Following heparin levels only now that correlating with aPTT.   Heparin level is SUBtherapeutic at 0.26 on 950 units/hr. CBC stable, no bleed documented. No problems with infusion per RN.  Goal of Therapy:  Heparin level 0.3-0.7 units/ml Monitor platelets by anticoagulation protocol: Yes   Plan:  - Increase heparin drip to 1050 units/hr - Daily heparin level/CBC - Monitor for s/sx bleeding - Eliquis on hold  Loura BackJennifer Minerva, PharmD, BCPS Clinical Pharmacist Phone for today [phone]- [phone] Main pharmacy - [phone] 03/12/2016 7:23 AM   Addendum: Consulted to switch heparin to apixaban.  Stop heparin drip Apixaban 2.5 mg PO bid to begin this am - dose appropriate for age and weight Pharmacy will follow peripherally  Loura BackJennifer Thornton, PharmD, BCPS Clinical Pharmacist 03/12/2016 10:45 AM

## 2016-03-12 NOTE — NC FL2 (Signed)
[place] MEDICAID FL2 LEVEL OF CARE SCREENING TOOL     IDENTIFICATION  Patient Name: Heidi George Birthdate: 21-Apr-1931 Sex: female Admission Date (Current Location): 03/05/2016  [facility] [place]   Facility and Address:  [facility] [place], [address], [place], [place] [address]      Provider Number: [id number]  Attending Physician Name and Address:  Dorothea OgleIskra M Myers, MD  Relative Name and Phone Number:       Current Level of Care: Other (Comment) (Special Care Memory Unit) Recommended Level of Care: Other (Comment) (Special Care Memory Unit) Prior Approval Number:    Date Approved/Denied:   PASRR Number:    Discharge Plan: Other (Comment) [facility]    Current Diagnoses: Patient Active Problem List   Diagnosis Date Noted  . Diverticulitis 03/06/2016  . HTN (hypertension) 03/06/2016  . Diabetes (HCC) 03/06/2016  . Hypothyroidism 03/06/2016  . Acute diverticulitis 03/06/2016  . Diverticulitis of large intestine with perforation   . Atrial fibrillation with controlled ventricular response (HCC) 11/25/2014    Orientation RESPIRATION BLADDER Height & Weight     Self  Normal Continent, Incontinent Weight: 125 lb 10.6 oz (57 kg) Height:  5\' 6"  (167.6 cm)  BEHAVIORAL SYMPTOMS/MOOD NEUROLOGICAL BOWEL NUTRITION STATUS      Continent, Incontinent Diet  AMBULATORY STATUS COMMUNICATION OF NEEDS Skin   Extensive Assist Verbally Normal                       Personal Care Assistance Level of Assistance  Bathing, Dressing Bathing Assistance: Maximum assistance   Dressing Assistance: Maximum assistance     Functional Limitations Info             SPECIAL CARE FACTORS FREQUENCY                       Contractures      Additional Factors Info   (Full)               Current Medications (03/12/2016):  This is the current hospital active medication list Current Facility-Administered Medications   Medication Dose Route Frequency Provider Last Rate Last Dose  . acetaminophen (TYLENOL) tablet 650 mg  650 mg Oral Q6H PRN Michael LitterNikki Carter, MD   650 mg at 03/08/16 2100   Or  . acetaminophen (TYLENOL) suppository 650 mg  650 mg Rectal Q6H PRN Michael LitterNikki Carter, MD      . amoxicillin-clavulanate (AUGMENTIN) 875-125 MG per tablet 1 tablet  1 tablet Oral Q12H Abigail Miyamotoouglas Blackman, MD   1 tablet at 03/12/16 1117  . apixaban (ELIQUIS) tablet 2.5 mg  2.5 mg Oral BID Lynita LombardJennifer D [facility], RPH   2.5 mg at 03/12/16 1117  . cloNIDine (CATAPRES) tablet 0.2 mg  0.2 mg Oral BID Rodolph Bonganiel V Thompson, MD   0.2 mg at 03/12/16 1118  . donepezil (ARICEPT) tablet 10 mg  10 mg Oral QHS Michael LitterNikki Carter, MD   10 mg at 03/11/16 2104  . insulin aspart (novoLOG) injection 0-15 Units  0-15 Units Subcutaneous TID WC Rodolph Bonganiel V Thompson, MD   2 Units at 03/11/16 1856  . levothyroxine (SYNTHROID, LEVOTHROID) tablet 150 mcg  150 mcg Oral QAC breakfast Michael LitterNikki Carter, MD   150 mcg at 03/12/16 0735  . losartan (COZAAR) tablet 100 mg  100 mg Oral Daily Michael LitterNikki Carter, MD   100 mg at 03/12/16 1117  . memantine (NAMENDA XR) 24 hr capsule 28 mg  28 mg Oral Daily Michael LitterNikki Carter, MD   28 mg at 03/12/16 1118  . ondansetron (ZOFRAN) tablet 4 mg  4 mg Oral Q6H PRN Michael LitterNikki Carter, MD       Or  . ondansetron [facility](ZOFRAN) injection 4 mg  4 mg Intravenous Q6H PRN Michael LitterNikki Carter, MD      . spironolactone (ALDACTONE) tablet 25 mg  25 mg Oral BID Michael LitterNikki Carter, MD   25 mg at 03/12/16 [id number]     Discharge Medications: Please see discharge summary for a list of discharge medications.  Relevant Imaging Results:  Relevant Lab Results:   Additional Information  (SS: [id number])  Alene MiresWhitaker, Byron Tipping R

## 2016-03-12 NOTE — Progress Notes (Signed)
  Subjective: She appears comfortable sitting in the chair. Complains of abdominal pain. He cannot remember what she had for breakfast.  Objective: Vital signs in last 24 hours: Temp:  [98 F (36.7 C)-98.3 F (36.8 C)] 98.2 F (36.8 C) (11/10 0516) Pulse Rate:  [48-53] 48 (11/10 0516) Resp:  [18] 18 (11/10 0516) BP: (138-162)/(46-60) 138/46 (11/10 0516) SpO2:  [95 %-96 %] 95 % (11/10 0516) Last BM Date: 03/10/16 Afebrile vital signs are stable WBC 10,800 Intake/Output from previous day: 11/09 0701 - 11/10 0700 In: -  Out: 600 [Urine:600] Intake/Output this shift: Total I/O In: 120 [P.O.:120] Out: 0   General appearance: alert, cooperative and no distress GI: soft, non-tender; bowel sounds normal; no masses,  no organomegaly  Lab Results:   Recent Labs  03/11/16 0306 03/12/16 0345  WBC 10.7* 10.8*  HGB 10.1* 10.5*  HCT 31.6* 32.2*  PLT 245 275    BMET  Recent Labs  03/11/16 0306 03/12/16 0345  NA 136 138  K 4.2 4.0  CL 107 107  CO2 22 21*  GLUCOSE 162* 118*  BUN 10 13  CREATININE 1.02* 0.93  CALCIUM 8.7* 9.1   PT/INR No results for input(s): LABPROT, INR in the last 72 hours.   Recent Labs Lab 03/05/16 2250 03/06/16 1017  AST 30 19  ALT 25 20  ALKPHOS 38 31*  BILITOT 0.6 0.6  PROT 7.9 7.1  ALBUMIN 3.4* 3.1*     Lipase  No results found for: LIPASE   Studies/Results: No results found.  Medications: . amoxicillin-clavulanate  1 tablet Oral Q12H  . apixaban  2.5 mg Oral BID  . cloNIDine  0.2 mg Oral BID  . donepezil  10 mg Oral QHS  . insulin aspart  0-15 Units Subcutaneous TID WC  . levothyroxine  150 mcg Oral QAC breakfast  . losartan  100 mg Oral Daily  . memantine  28 mg Oral Daily  . spironolactone  25 mg Oral BID    Assessment/Plan Ileal diverticulitis with contained perforation Dementia DM HTN Atrial fibrillation - Chronic anticoagulation - on Eliquis at home; heparin per pharmacy FEN: soft diet ID: Day 5Cipro  Flagyl completed 11/7, started on Augmentin day 2 DVT: Heparin drip/SCD  Patient for discharge today on Augmentin and she needs a total of 10 days of treatment. Follow-up with primary care.   LOS: 6 days    Heidi George 03/12/2016 [phone]

## 2016-03-12 NOTE — Discharge Instructions (Signed)
Diverticulitis  Diverticulitis is when small pockets that have formed in your colon (large intestine) become infected or swollen.  HOME CARE  · Follow your doctor's instructions.  · Follow a special diet if told by your doctor.  · When you feel better, your doctor may tell you to change your diet. You may be told to eat a lot of fiber. Fruits and vegetables are good sources of fiber. Fiber makes it easier to poop (have bowel movements).  · Take supplements or probiotics as told by your doctor.  · Only take medicines as told by your doctor.  · Keep all follow-up visits with your doctor.  GET HELP IF:  · Your pain does not get better.  · You have a hard time eating food.  · You are not pooping like normal.  GET HELP RIGHT AWAY IF:  · Your pain gets worse.  · Your problems do not get better.  · Your problems suddenly get worse.  · You have a fever.  · You keep throwing up (vomiting).  · You have bloody or black, tarry poop (stool).  MAKE SURE YOU:   · Understand these instructions.  · Will watch your condition.  · Will get help right away if you are not doing well or get worse.     This information is not intended to replace advice given to you by your health care provider. Make sure you discuss any questions you have with your health care provider.     Document Released: 10/06/2007 Document Revised: 04/24/2013 Document Reviewed: 03/14/2013  Elsevier Interactive Patient Education ©2016 [employer]

## 2016-03-12 NOTE — [place] FL2 (Signed)
[name] MEDICAID FL2 LEVEL OF CARE SCREENING TOOL     IDENTIFICATION  Patient Name: Heidi George Birthdate: 06/11/1930 Sex: female Admission Date (Current Location): 03/05/2016  [facility] [place]   Facility and Address:  [facility], [address], [place], [place] [address]      Provider Number: [id number]  Attending Physician Name and Address:  Dorothea OgleIskra M Myers, MD  Relative Name and Phone Number:       Current Level of Care: Hospital Recommended Level of Care: Skilled Nursing Facility Prior Approval Number:    Date Approved/Denied:   PASRR Number:    Discharge Plan: SNF    Current Diagnoses: Patient Active Problem List   Diagnosis Date Noted  . Diverticulitis 03/06/2016  . HTN (hypertension) 03/06/2016  . Diabetes (HCC) 03/06/2016  . Hypothyroidism 03/06/2016  . Acute diverticulitis 03/06/2016  . Diverticulitis of large intestine with perforation   . Atrial fibrillation with controlled ventricular response (HCC) 11/25/2014    Orientation RESPIRATION BLADDER Height & Weight     Self  Normal Incontinent Weight: 125 lb 10.6 oz (57 kg) Height:  5\' 6"  (167.6 cm)  BEHAVIORAL SYMPTOMS/MOOD NEUROLOGICAL BOWEL NUTRITION STATUS      Incontinent  (Normal)  AMBULATORY STATUS COMMUNICATION OF NEEDS Skin   Extensive Assist Verbally Normal                       Personal Care Assistance Level of Assistance  Bathing, Dressing Bathing Assistance: Maximum assistance   Dressing Assistance: Maximum assistance     Functional Limitations Info             SPECIAL CARE FACTORS FREQUENCY                       Contractures      Additional Factors Info                  Current Medications (03/12/2016):  This is the current hospital active medication list Current Facility-Administered Medications  Medication Dose Route Frequency Provider Last Rate Last Dose  . acetaminophen (TYLENOL) tablet 650 mg   650 mg Oral Q6H PRN Michael LitterNikki Carter, MD   650 mg at 03/08/16 2100   Or  . acetaminophen (TYLENOL) suppository 650 mg  650 mg Rectal Q6H PRN Michael LitterNikki Carter, MD      . amoxicillin-clavulanate (AUGMENTIN) 875-125 MG per tablet 1 tablet  1 tablet Oral Q12H Abigail Miyamotoouglas Blackman, MD   1 tablet at 03/12/16 1117  . apixaban (ELIQUIS) tablet 2.5 mg  2.5 mg Oral BID Lynita LombardJennifer D [facility], RPH   2.5 mg at 03/12/16 1117  . cloNIDine (CATAPRES) tablet 0.2 mg  0.2 mg Oral BID Rodolph Bonganiel V Thompson, MD   0.2 mg at 03/12/16 1118  . donepezil (ARICEPT) tablet 10 mg  10 mg Oral QHS Michael LitterNikki Carter, MD   10 mg at 03/11/16 2104  . insulin aspart (novoLOG) injection 0-15 Units  0-15 Units Subcutaneous TID WC Rodolph Bonganiel V Thompson, MD   2 Units at 03/11/16 1856  . levothyroxine (SYNTHROID, LEVOTHROID) tablet 150 mcg  150 mcg Oral QAC breakfast Michael LitterNikki Carter, MD   150 mcg at 03/12/16 0735  . losartan (COZAAR) tablet 100 mg  100 mg Oral Daily Michael LitterNikki Carter, MD   100 mg at 03/12/16 1117  . memantine (NAMENDA XR) 24 hr capsule 28 mg  28 mg Oral Daily Michael LitterNikki Carter, MD   28 mg at  03/12/16 1118  . ondansetron (ZOFRAN) tablet 4 mg  4 mg Oral Q6H PRN Michael LitterNikki Carter, MD       Or  . ondansetron [facility](ZOFRAN) injection 4 mg  4 mg Intravenous Q6H PRN Michael LitterNikki Carter, MD      . spironolactone (ALDACTONE) tablet 25 mg  25 mg Oral BID Michael LitterNikki Carter, MD   25 mg at 03/12/16 [id number]     Discharge Medications: Please see discharge summary for a list of discharge medications.  Relevant Imaging Results:  Relevant Lab Results:   Additional Information  (SS: [id number])  Alene MiresWhitaker, Rameen Quinney R

## 2016-03-12 NOTE — Progress Notes (Signed)
SW reached out to facility and spoke with facility business department who states pt is welcomed to come back to [facility] and they have spoken with daughter and made her aware that alarms need to be purchased for patient. She states daughter is aware and has purchased the alarms.   SW spoke with nurse who states that family may be interested in transporting pt instead of using [facility]. SW reached out to daughter/Gail who states that she does not want pt to use [facility]. She states that she wants to transport the pt. SW informed her to let her know if anything changes regarding transportation.   Crista CurbBrittney Chesney Klimaszewski, MSW

## 2016-03-12 NOTE — Progress Notes (Signed)
Report called to [facility].

## 2016-03-12 NOTE — Progress Notes (Signed)
Occupational Therapy Treatment Patient Details Name: Solon AugustaSybil Bracewell MRN: [id number] DOB: [birth year] Today's Date: 03/12/2016    History of present illness Solon AugustaSybil Pooler is a 82 y.o. woman with a history of Alzheimer's dementia (she is disoriented at baseline but recognizes family; engages in "casual"conversation), HTN, hypothyroidism, DM, and atrial fibrillation (CHADS-Vasc score of 4, anticoagulated with Eliquis) who has had atypical behaviors within the 24 hours prior to presentation.  Because of her dementia, she cannot really articulate her symptoms, and she never really indicated or localized pain.  However, caregivers in her memory unit noticed increased weakness, decreased activity level, and apparent confusion (different from her baseline).  Pt with diverticulitis with perforation.  Afib.     OT comments  Pt making progress with functional goals. Pt will be [facility]. Pt's daughter planning to provide pt with personal fall prevention alarm at facility  Follow Up Recommendations  Supervision/Assistance - 24 hour;Other (comment) (back to ALF memmory care unit)    Equipment Recommendations  None recommended by OT    Recommendations for Other Services      Precautions / Restrictions Precautions Precautions: Fall Restrictions Weight Bearing Restrictions: No       Mobility Bed Mobility Overal bed mobility: Needs Assistance Bed Mobility: Supine to Sit;Sit to Supine     Supine to sit: Supervision Sit to supine: Supervision      Transfers Overall transfer level: Needs assistance Equipment used: None Transfers: Sit to/from Stand Sit to Stand: Min guard         General transfer comment: Had to guard pt for transitional movement as she is unsteady at times if distracted and cues due to processing difficulties.      Balance     Sitting balance-Leahy Scale: Good       Standing balance-Leahy Scale: Poor                      ADL  Pt participated in toilet transfer with min guard A and cues to BSC, mod A with clothing mgt and hygiene with mod verbal cues, LB dressing to donn socks and clean brief with min A, sup to donn clean gown seated at EOB                                              Vision  no change from baseline                              Cognition   Behavior During Therapy: Impulsive Overall Cognitive Status: History of cognitive impairments - at baseline       Memory: Decreased short-term memory;Decreased recall of precautions               Extremity/Trunk Assessment   generalized weakness                        General Comments  pt very pleasant and cooperative, daughter very supportive    Pertinent Vitals/ Pain       Pain Assessment: No/denies pain  Home Living  memory care facility  Frequency  Min 2X/week        Progress Toward Goals  OT Goals(current goals can now be found in the care plan section)  Progress towards OT goals: Progressing toward goals  Acute Rehab OT Goals Patient Stated Goal: unable to state  Plan Discharge plan remains appropriate                     End of Session Equipment Utilized During Treatment: Gait belt;Oxygen (BSC)   Activity Tolerance Patient tolerated treatment well   Patient Left in bed;with call bell/phone within reach;with bed alarm set;with family/visitor present             Time: 2952-84130955-1024 OT Time Calculation (min): 29 min  Charges: OT General Charges $OT Visit: 1 Procedure OT Treatments $Self Care/Home Management : 8-22 mins $Therapeutic Activity: 8-22 mins  Galen ManilaSpencer, Henny Strauch Jeanette 03/12/2016, 12:00 PM

## 2016-03-15 LAB — URINE CULTURE: Culture: NO GROWTH

## 2016-06-11 ENCOUNTER — Encounter (HOSPITAL_COMMUNITY): Payer: Self-pay

## 2016-06-11 ENCOUNTER — Emergency Department (HOSPITAL_COMMUNITY): Payer: Medicare Other

## 2016-06-11 ENCOUNTER — Emergency Department (HOSPITAL_COMMUNITY)
Admission: EM | Admit: 2016-06-11 | Discharge: 2016-06-11 | Disposition: A | Discharge status: Home / Self Care | Payer: Medicare Other | Attending: Emergency Medicine | Admitting: Emergency Medicine

## 2016-06-11 DIAGNOSIS — I10 Essential (primary) hypertension: Secondary | ICD-10-CM | POA: Diagnosis not present

## 2016-06-11 DIAGNOSIS — G309 Alzheimer's disease, unspecified: Secondary | ICD-10-CM | POA: Diagnosis not present

## 2016-06-11 DIAGNOSIS — Y929 Unspecified place or not applicable: Secondary | ICD-10-CM | POA: Diagnosis not present

## 2016-06-11 DIAGNOSIS — Z7984 Long term (current) use of oral hypoglycemic drugs: Secondary | ICD-10-CM | POA: Diagnosis not present

## 2016-06-11 DIAGNOSIS — E119 Type 2 diabetes mellitus without complications: Secondary | ICD-10-CM | POA: Insufficient documentation

## 2016-06-11 DIAGNOSIS — Y999 Unspecified external cause status: Secondary | ICD-10-CM | POA: Diagnosis not present

## 2016-06-11 DIAGNOSIS — Y939 Activity, unspecified: Secondary | ICD-10-CM | POA: Insufficient documentation

## 2016-06-11 DIAGNOSIS — F0280 Dementia in other diseases classified elsewhere without behavioral disturbance: Secondary | ICD-10-CM | POA: Insufficient documentation

## 2016-06-11 DIAGNOSIS — E039 Hypothyroidism, unspecified: Secondary | ICD-10-CM | POA: Diagnosis not present

## 2016-06-11 DIAGNOSIS — Z79899 Other long term (current) drug therapy: Secondary | ICD-10-CM | POA: Insufficient documentation

## 2016-06-11 DIAGNOSIS — Z7901 Long term (current) use of anticoagulants: Secondary | ICD-10-CM | POA: Diagnosis not present

## 2016-06-11 DIAGNOSIS — S4991XA Unspecified injury of right shoulder and upper arm, initial encounter: Secondary | ICD-10-CM | POA: Diagnosis present

## 2016-06-11 DIAGNOSIS — S42351A Displaced comminuted fracture of shaft of humerus, right arm, initial encounter for closed fracture: Secondary | ICD-10-CM

## 2016-06-11 DIAGNOSIS — W19XXXA Unspecified fall, initial encounter: Secondary | ICD-10-CM | POA: Insufficient documentation

## 2016-06-11 MED ORDER — HYDROCODONE-ACETAMINOPHEN 5-325 MG PO TABS
1.0000 | ORAL_TABLET | Freq: Once | ORAL | Status: AC
  Administered 2016-06-11: 1 via ORAL
  Filled 2016-06-11: qty 1

## 2016-06-11 MED ORDER — HYDROCODONE-ACETAMINOPHEN 5-325 MG PO TABS: 1.0000 | ORAL_TABLET | Freq: Four times a day (QID) | ORAL | 0 refills | Status: DC | PRN

## 2016-06-11 NOTE — Discharge Instructions (Signed)
Follow-up with Dr. Magnus IvanBlackman.  Return here as needed.  Ice to the area

## 2016-06-11 NOTE — ED Triage Notes (Signed)
[facility]- pt coming from [facility] memory care unit after a fall. Pt has right upper arm pain with bruising noted. No LOC per staff at facility. Hx of dementia.

## 2016-06-11 NOTE — ED Notes (Signed)
Ortho at bedside applying splint

## 2016-06-11 NOTE — ED Notes (Signed)
Pt currently in CT/XR.

## 2016-06-11 NOTE — ED Provider Notes (Signed)
[facility]-EMERGENCY DEPT Provider Note   CSN: [id number] Arrival date & time: 06/11/16  1209     History   Chief Complaint Chief Complaint  Patient presents with  . Fall  . Arm Injury    HPI Heidi George is a 83 y.o. female.  HPI Patient presents to the emergency department with injuries following a fall that occurred prior to arrival.  Patient lives in a memory care unit, and had an unwitnessed fall.  The patient is unable to give me any history due to her dementia.  Patient does complain of pain to the right shoulder and left ribs. Past Medical History:  Diagnosis Date  . Alzheimer's dementia   . Atrial fibrillation (HCC)   . Diabetes mellitus without complication (HCC)   . Hypertension   . Hypothyroidism     Patient Active Problem List   Diagnosis Date Noted  . Diverticulitis 03/06/2016  . HTN (hypertension) 03/06/2016  . Diabetes (HCC) 03/06/2016  . Hypothyroidism 03/06/2016  . Acute diverticulitis 03/06/2016  . Diverticulitis of large intestine with perforation   . Atrial fibrillation with controlled ventricular response (HCC) 11/25/2014    History reviewed. No pertinent surgical history.  OB History    No data available       Home Medications    Prior to Admission medications   Medication Sig Start Date End Date Taking? Authorizing Provider  apixaban (ELIQUIS) 2.5 MG TABS tablet Take 2.5 mg by mouth 2 (two) times daily.   Yes Historical Provider, MD  cloNIDine (CATAPRES) 0.1 MG tablet Take 0.1 mg by mouth 2 (two) times daily.   Yes Historical Provider, MD  donepezil (ARICEPT) 10 MG tablet Take 10 mg by mouth at bedtime.   Yes Historical Provider, MD  ferrous sulfate 325 (65 FE) MG tablet Take 325 mg by mouth daily with breakfast.   Yes Historical Provider, MD  levothyroxine (SYNTHROID, LEVOTHROID) 150 MCG tablet Take 150 mcg by mouth daily before breakfast.   Yes Historical Provider, MD  losartan (COZAAR) 100 MG tablet Take 100 mg by mouth daily.   Yes  Historical Provider, MD  memantine (NAMENDA XR) 28 MG CP24 24 hr capsule Take 28 mg by mouth daily.   Yes Historical Provider, MD  metFORMIN (GLUCOPHAGE-XR) 500 MG 24 hr tablet Take 500 mg by mouth daily with breakfast.   Yes Historical Provider, MD  OVER THE COUNTER MEDICATION Take 2 tablets by mouth daily. Vitafusion Calcium gummy   Yes Historical Provider, MD  spironolactone (ALDACTONE) 25 MG tablet Take 25 mg by mouth 2 (two) times daily.   Yes Historical Provider, MD    Family History History reviewed. No pertinent family history.  Social History Social History  Substance Use Topics  . Smoking status: Unknown If Ever Smoked  . Smokeless tobacco: Never Used  . Alcohol use No     Allergies   Promethazine   Review of Systems Review of Systems Level V caveat applies due to dementia  Physical Exam Updated Vital Signs BP (!) 223/71   Pulse 68   Temp 97.8 F (36.6 C) (Oral)   Resp 14   SpO2 96%   Physical Exam  Constitutional: She appears well-developed and well-nourished. No distress.  HENT:  Head: Normocephalic and atraumatic.  Mouth/Throat: Oropharynx is clear and moist.  Eyes: Pupils are equal, round, and reactive to light.  Neck: Normal range of motion. Neck supple.  Cardiovascular: Normal rate, regular rhythm and normal heart sounds.  Exam reveals no gallop and no friction  rub.   No murmur heard. Pulmonary/Chest: Effort normal and breath sounds normal. No respiratory distress. She has no wheezes.  Abdominal: Soft. Bowel sounds are normal. She exhibits no distension. There is no tenderness.  Musculoskeletal:       Right shoulder: She exhibits decreased range of motion, tenderness, bony tenderness and deformity.  Neurological: She is alert. She exhibits normal muscle tone. Coordination normal.  Skin: Skin is warm and dry. No rash noted. No erythema.  Psychiatric: She has a normal mood and affect. Her behavior is normal.  Nursing note and vitals  reviewed.    ED Treatments / Results  Labs (all labs ordered are listed, but only abnormal results are displayed) Labs Reviewed - No data to display  EKG  EKG Interpretation None       Radiology Dg Ribs Unilateral W/chest Left  Result Date: 06/11/2016 CLINICAL DATA:  83 year old female status post fall with right upper arm pain and bruising. Initial encounter. EXAM: LEFT RIBS AND CHEST - 3+ VIEW COMPARISON:  Chest radiographs 03/05/2016. Right shoulder series from today reported separately. FINDINGS: Proximal right humerus fracture, see shoulder series. Improved lung volumes compared to 2017. Stable cardiomegaly and mediastinal contours. Calcified aortic atherosclerosis. Visualized tracheal air column is within normal limits. No pneumothorax, pulmonary edema, pleural effusion or confluent pulmonary opacity. Mild increased interstitial markings appear chronic and stable. Osteopenia. Three oblique views of the left ribs are provided. No displaced left rib fracture identified. However, there is evidence of a T9 vertebral body compression fracture which is new since November (arrow). Other visible osseous structures including those at the left shoulder appear intact. IMPRESSION: 1. Proximal right humerus fracture, see shoulder series today. 2. Mild T9 compression fracture suspected and new since November 2017. Query acute back pain. 3.  No displaced left rib fracture identified. 4. No acute cardiopulmonary abnormality. Stable cardiomegaly. Calcified aortic atherosclerosis. Electronically Signed   By: Odessa Fleming M.D.   On: 06/11/2016 15:31   Dg Shoulder Right  Result Date: 06/11/2016 CLINICAL DATA:  83 year old female status post fall with right upper arm pain and bruising. Initial encounter. EXAM: RIGHT SHOULDER - 2+ VIEW COMPARISON:  None. FINDINGS: There is a comminuted spiral fracture of the proximal right humerus extending from the medial humeral head (arrows) distally into the proximal shaft.  There is a large 9 cm butterfly fragment displaced posteriorly and laterally along with 1 full shaft width posterior and lateral displacement of the distal fragment. Mild medial angulation of the distal fragment. No superimposed right glenohumeral joint dislocation. The right clavicle in scapula appear intact. The visible right ribs and lung parenchyma appear within normal limits. IMPRESSION: 1. Comminuted, long spiral fracture of the proximal right humerus extending from the medial humeral head into the shaft. A large butterfly fragment is displaced posteriorly and laterally 1 full shaft width along with the distal fragment. 2. Right clavicle and scapula appear intact. Electronically Signed   By: Odessa Fleming M.D.   On: 06/11/2016 15:27   Ct Head Wo Contrast  Result Date: 06/11/2016 CLINICAL DATA:  Fall with shoulder pain. History of dementia. Initial encounter. EXAM: CT HEAD WITHOUT CONTRAST CT CERVICAL SPINE WITHOUT CONTRAST TECHNIQUE: Multidetector CT imaging of the head and cervical spine was performed following the standard protocol without intravenous contrast. Multiplanar CT image reconstructions of the cervical spine were also generated. COMPARISON:  Brain MRI 11/08/2012 FINDINGS: CT HEAD FINDINGS Brain: No evidence of acute infarction, hemorrhage, hydrocephalus, extra-axial collection or mass lesion/mass effect. Chronic small vessel  disease with confluent ischemic gliosis in the deep cerebral white matter. Mild generalized volume loss . Vascular: Atherosclerotic calcification. Skull: Negative for fracture Sinuses/Orbits: Cataract resection on the right. Ethmoidectomies. No acute finding. CT CERVICAL SPINE FINDINGS Alignment: No traumatic malalignment. Skull base and vertebrae: Negative for acute fracture. Soft tissues and spinal canal: No prevertebral fluid or swelling. No visible canal hematoma. Disc levels: Disc degeneration uncovertebral ridging mainly from C4-5 to C6-7. Upper chest: Biapical pleural  scarring.  No acute finding. IMPRESSION: No evidence of acute intracranial or cervical spine injury. Electronically Signed   By: Marnee Spring M.D.   On: 06/11/2016 15:48   Ct Cervical Spine Wo Contrast  Result Date: 06/11/2016 CLINICAL DATA:  Fall with shoulder pain. History of dementia. Initial encounter. EXAM: CT HEAD WITHOUT CONTRAST CT CERVICAL SPINE WITHOUT CONTRAST TECHNIQUE: Multidetector CT imaging of the head and cervical spine was performed following the standard protocol without intravenous contrast. Multiplanar CT image reconstructions of the cervical spine were also generated. COMPARISON:  Brain MRI 11/08/2012 FINDINGS: CT HEAD FINDINGS Brain: No evidence of acute infarction, hemorrhage, hydrocephalus, extra-axial collection or mass lesion/mass effect. Chronic small vessel disease with confluent ischemic gliosis in the deep cerebral white matter. Mild generalized volume loss . Vascular: Atherosclerotic calcification. Skull: Negative for fracture Sinuses/Orbits: Cataract resection on the right. Ethmoidectomies. No acute finding. CT CERVICAL SPINE FINDINGS Alignment: No traumatic malalignment. Skull base and vertebrae: Negative for acute fracture. Soft tissues and spinal canal: No prevertebral fluid or swelling. No visible canal hematoma. Disc levels: Disc degeneration uncovertebral ridging mainly from C4-5 to C6-7. Upper chest: Biapical pleural scarring.  No acute finding. IMPRESSION: No evidence of acute intracranial or cervical spine injury. Electronically Signed   By: Marnee Spring M.D.   On: 06/11/2016 15:48   Dg Humerus Right  Result Date: 06/11/2016 CLINICAL DATA:  83 year old female status post fall with right upper arm pain and bruising. Initial encounter. EXAM: RIGHT HUMERUS - 2+ VIEW COMPARISON:  Right shoulder series from today reported separately. FINDINGS: Spiral comminuted proximal right humerus fracture re - demonstrated extending into the proximal third of the right humerus  shaft. See comparison right shoulder series. The midshaft and more distal right humerus appears intact with grossly normal alignment at the right elbow. IMPRESSION: 1. Comminuted proximal right humerus fracture as detailed on the right shoulder series today. 2. The midshaft and more distal right humerus is intact. Electronically Signed   By: Odessa Fleming M.D.   On: 06/11/2016 15:32    Procedures Procedures (including critical care time)  Medications Ordered in ED Medications  HYDROcodone-acetaminophen (NORCO/VICODIN) 5-325 MG per tablet 1 tablet (1 tablet Oral Given 06/11/16 1437)     Initial Impression / Assessment and Plan / ED Course  I have reviewed the triage vital signs and the nursing notes.  Pertinent labs & imaging results that were available during my care of the patient were reviewed by me and considered in my medical decision making (see chart for details).    The patient will be treated for her humerus fracture with a shoulder immobilizer.  The patient has no other acute injuries.  She does not have any back pain on my examination.  Hips are stable.  She has no pain on range of motion of the hips   Final Clinical Impressions(s) / ED Diagnoses   Final diagnoses:  Fall    New Prescriptions New Prescriptions   No medications on file     Charlestine Night, PA-C 06/11/16 1555  Benjiman Core, MD 06/11/16 [phone]

## 2016-06-11 NOTE — Progress Notes (Signed)
Orthopedic Tech Progress Note Patient Details:  Heidi George 7/31/1932[name] [id number]  Ortho Devices Type of Ortho Device: Ace wrap, Sling immobilizer, Coapt, Post (long arm) splint Ortho Device/Splint Location: RUE Ortho Device/Splint Interventions: Ordered, Application   Jennye MoccasinHughes, Ruthmary Occhipinti Heidi George 06/11/2016, 5:03 PM

## 2016-06-15 ENCOUNTER — Encounter (INDEPENDENT_AMBULATORY_CARE_PROVIDER_SITE_OTHER): Payer: Self-pay | Admitting: Orthopaedic Surgery

## 2016-06-15 ENCOUNTER — Ambulatory Visit (INDEPENDENT_AMBULATORY_CARE_PROVIDER_SITE_OTHER): Payer: Medicare Other | Admitting: Orthopaedic Surgery

## 2016-06-15 DIAGNOSIS — S42351A Displaced comminuted fracture of shaft of humerus, right arm, initial encounter for closed fracture: Secondary | ICD-10-CM

## 2016-06-15 NOTE — Progress Notes (Signed)
   Office Visit Note   Patient: Heidi AugustaSybil George           Date of Birth: November 23, 1930           MRN: [id number] Visit Date: 06/15/2016              Requested by: No referring provider defined for this encounter. PCP: No PCP Per Patient   Assessment & Plan: Visit Diagnoses:  1. Closed displaced comminuted fracture of shaft of right humerus, initial encounter     Plan: patient is low demand and has end stage dementia, seems to be comfortable in sling.  Will attempt nonop treatment.  F/u 2 weeks for repeat 2 view xrays of humerus  Follow-Up Instructions: Return in about 2 weeks (around 06/29/2016).   Orders:  No orders of the defined types were placed in this encounter.  No orders of the defined types were placed in this encounter.     Procedures: No procedures performed   Clinical Data: No additional findings.   Subjective: Chief Complaint  Patient presents with  . Right Shoulder - Pain    83 yo severely demented female comes in from ER follow up for right humeral shaft fracture.  She is in memory care, low demand and lives at nursing home.  She endorses mild pain.  She is minimally verbal.  Most of HPI is from son.      Review of Systems  Constitutional: Negative.   HENT: Negative.   Eyes: Negative.   Respiratory: Negative.   Cardiovascular: Negative.   Endocrine: Negative.   Musculoskeletal: Negative.   Neurological: Negative.   Hematological: Negative.   Psychiatric/Behavioral: Negative.   All other systems reviewed and are negative.    Objective: Vital Signs: There were no vitals taken for this visit.  Physical Exam  Constitutional: She is oriented to person, place, and time. She appears well-developed and well-nourished.  HENT:  Head: Normocephalic and atraumatic.  Eyes: EOM are normal.  Neck: Neck supple.  Pulmonary/Chest: Effort normal.  Abdominal: Soft.  Neurological: She is alert and oriented to person, place, and time.  Skin: Skin is warm.  Capillary refill takes less than 2 seconds.  Psychiatric: She has a normal mood and affect. Her behavior is normal. Judgment and thought content normal.  Nursing note and vitals reviewed.   Ortho Exam RUE exam - severe bruising - mild swelling - skin intact - NVI  Specialty Comments:  No specialty comments available.  Imaging: No results found.   PMFS History: Patient Active Problem List   Diagnosis Date Noted  . Closed displaced comminuted fracture of shaft of right humerus 06/15/2016  . Diverticulitis 03/06/2016  . HTN (hypertension) 03/06/2016  . Diabetes (HCC) 03/06/2016  . Hypothyroidism 03/06/2016  . Acute diverticulitis 03/06/2016  . Diverticulitis of large intestine with perforation   . Atrial fibrillation with controlled ventricular response (HCC) 11/25/2014   Past Medical History:  Diagnosis Date  . Alzheimer's dementia   . Atrial fibrillation (HCC)   . Diabetes mellitus without complication (HCC)   . Hypertension   . Hypothyroidism     No family history on file.  No past surgical history on file. Social History   Occupational History  . Not on file.   Social History Main Topics  . Smoking status: Unknown If Ever Smoked  . Smokeless tobacco: Never Used  . Alcohol use No  . Drug use: No  . Sexual activity: Not on file

## 2016-06-29 ENCOUNTER — Ambulatory Visit (INDEPENDENT_AMBULATORY_CARE_PROVIDER_SITE_OTHER): Payer: Medicare Other

## 2016-06-29 ENCOUNTER — Encounter (INDEPENDENT_AMBULATORY_CARE_PROVIDER_SITE_OTHER): Payer: Self-pay | Admitting: Orthopaedic Surgery

## 2016-06-29 ENCOUNTER — Ambulatory Visit (INDEPENDENT_AMBULATORY_CARE_PROVIDER_SITE_OTHER): Payer: Medicare Other | Admitting: Orthopaedic Surgery

## 2016-06-29 DIAGNOSIS — S42351D Displaced comminuted fracture of shaft of humerus, right arm, subsequent encounter for fracture with routine healing: Secondary | ICD-10-CM

## 2016-06-29 NOTE — Progress Notes (Signed)
Heidi George comes back today for her displaced proximal humerus fracture. She is overall doing better per her son. She is tolerating the sling fine. On exam she is comfortable in her sling. The swelling and bruising have improved. The x-ray show stable alignment of the fracture. No signs of healing just yet. We will plan on continuing on operative treatment in a sling. We'll see her back in 4 weeks with repeat 2 view x-rays of the right humerus.

## 2016-07-27 ENCOUNTER — Encounter (INDEPENDENT_AMBULATORY_CARE_PROVIDER_SITE_OTHER): Payer: Self-pay | Admitting: Orthopaedic Surgery

## 2016-07-27 ENCOUNTER — Ambulatory Visit (INDEPENDENT_AMBULATORY_CARE_PROVIDER_SITE_OTHER): Payer: Medicare Other | Admitting: Orthopaedic Surgery

## 2016-07-27 ENCOUNTER — Ambulatory Visit (INDEPENDENT_AMBULATORY_CARE_PROVIDER_SITE_OTHER): Payer: Medicare Other

## 2016-07-27 ENCOUNTER — Encounter (INDEPENDENT_AMBULATORY_CARE_PROVIDER_SITE_OTHER): Payer: Self-pay

## 2016-07-27 DIAGNOSIS — S42351D Displaced comminuted fracture of shaft of humerus, right arm, subsequent encounter for fracture with routine healing: Secondary | ICD-10-CM

## 2016-07-27 NOTE — Progress Notes (Signed)
The patient is proximally 7 weeks status post comminuted proximal humeral shaft fracture. She has severe dementia and lives in a nursing home permanently. She is doing better per her son. Unable to move her arm and elbow and shoulder a lot more today without any apparent pain. X-rays show abundant callus formation with acceptable alignment. At this rate I want her to continue to wear the sling for a full 8 weeks after which she can discontinue this as tolerated. I will like to see her back in 6 weeks for repeat 2 view x-rays of the right humerus

## 2016-09-07 ENCOUNTER — Encounter (INDEPENDENT_AMBULATORY_CARE_PROVIDER_SITE_OTHER): Payer: Self-pay | Admitting: Orthopaedic Surgery

## 2016-09-07 ENCOUNTER — Ambulatory Visit (INDEPENDENT_AMBULATORY_CARE_PROVIDER_SITE_OTHER): Payer: Medicare Other

## 2016-09-07 ENCOUNTER — Ambulatory Visit (INDEPENDENT_AMBULATORY_CARE_PROVIDER_SITE_OTHER): Payer: Medicare Other | Admitting: Orthopaedic Surgery

## 2016-09-07 DIAGNOSIS — S42351D Displaced comminuted fracture of shaft of humerus, right arm, subsequent encounter for fracture with routine healing: Secondary | ICD-10-CM

## 2016-09-07 NOTE — Progress Notes (Signed)
Patient is almost 3 months status post a comminuted proximal humeral shaft fracture. She follows up today with no pain. She has no complaints. Her range of motion is very good and better than expected. X-ray show abundant callus formation of the fracture. No complications. At this point patient has healed this very well. Increase activity as tolerated. Follow-up with me as needed.

## 2016-12-09 ENCOUNTER — Encounter (HOSPITAL_COMMUNITY): Payer: Self-pay | Admitting: Emergency Medicine

## 2016-12-09 ENCOUNTER — Emergency Department (HOSPITAL_COMMUNITY): Payer: Medicare Other

## 2016-12-09 ENCOUNTER — Observation Stay (HOSPITAL_COMMUNITY)
Admission: EM | Admit: 2016-12-09 | Discharge: 2016-12-10 | Disposition: A | Discharge status: Home / Self Care | Payer: Medicare Other | Attending: Family Medicine | Admitting: Family Medicine

## 2016-12-09 DIAGNOSIS — Y92129 Unspecified place in nursing home as the place of occurrence of the external cause: Secondary | ICD-10-CM | POA: Diagnosis not present

## 2016-12-09 DIAGNOSIS — I4891 Unspecified atrial fibrillation: Secondary | ICD-10-CM | POA: Insufficient documentation

## 2016-12-09 DIAGNOSIS — E1159 Type 2 diabetes mellitus with other circulatory complications: Secondary | ICD-10-CM | POA: Diagnosis present

## 2016-12-09 DIAGNOSIS — Z7984 Long term (current) use of oral hypoglycemic drugs: Secondary | ICD-10-CM | POA: Insufficient documentation

## 2016-12-09 DIAGNOSIS — G309 Alzheimer's disease, unspecified: Secondary | ICD-10-CM | POA: Insufficient documentation

## 2016-12-09 DIAGNOSIS — S065X0A Traumatic subdural hemorrhage without loss of consciousness, initial encounter: Secondary | ICD-10-CM | POA: Diagnosis not present

## 2016-12-09 DIAGNOSIS — E119 Type 2 diabetes mellitus without complications: Secondary | ICD-10-CM | POA: Diagnosis not present

## 2016-12-09 DIAGNOSIS — W1830XA Fall on same level, unspecified, initial encounter: Secondary | ICD-10-CM | POA: Insufficient documentation

## 2016-12-09 DIAGNOSIS — E039 Hypothyroidism, unspecified: Secondary | ICD-10-CM | POA: Diagnosis present

## 2016-12-09 DIAGNOSIS — Y999 Unspecified external cause status: Secondary | ICD-10-CM | POA: Insufficient documentation

## 2016-12-09 DIAGNOSIS — R51 Headache: Secondary | ICD-10-CM | POA: Diagnosis present

## 2016-12-09 DIAGNOSIS — I152 Hypertension secondary to endocrine disorders: Secondary | ICD-10-CM | POA: Diagnosis present

## 2016-12-09 DIAGNOSIS — S065XAA Traumatic subdural hemorrhage with loss of consciousness status unknown, initial encounter: Secondary | ICD-10-CM

## 2016-12-09 DIAGNOSIS — Y939 Activity, unspecified: Secondary | ICD-10-CM | POA: Insufficient documentation

## 2016-12-09 DIAGNOSIS — R509 Fever, unspecified: Secondary | ICD-10-CM | POA: Diagnosis present

## 2016-12-09 DIAGNOSIS — S065X9A Traumatic subdural hemorrhage with loss of consciousness of unspecified duration, initial encounter: Secondary | ICD-10-CM

## 2016-12-09 DIAGNOSIS — N179 Acute kidney failure, unspecified: Secondary | ICD-10-CM | POA: Diagnosis present

## 2016-12-09 DIAGNOSIS — Z7901 Long term (current) use of anticoagulants: Secondary | ICD-10-CM | POA: Insufficient documentation

## 2016-12-09 DIAGNOSIS — I10 Essential (primary) hypertension: Secondary | ICD-10-CM | POA: Diagnosis not present

## 2016-12-09 DIAGNOSIS — D72829 Elevated white blood cell count, unspecified: Secondary | ICD-10-CM | POA: Diagnosis present

## 2016-12-09 DIAGNOSIS — Z79899 Other long term (current) drug therapy: Secondary | ICD-10-CM | POA: Insufficient documentation

## 2016-12-09 DIAGNOSIS — G935 Compression of brain: Secondary | ICD-10-CM | POA: Diagnosis present

## 2016-12-09 DIAGNOSIS — F0390 Unspecified dementia without behavioral disturbance: Secondary | ICD-10-CM

## 2016-12-09 DIAGNOSIS — W19XXXA Unspecified fall, initial encounter: Secondary | ICD-10-CM | POA: Diagnosis present

## 2016-12-09 LAB — BASIC METABOLIC PANEL
ANION GAP: 13 (ref 5–15)
BUN: 26 mg/dL — ABNORMAL HIGH (ref 6–20)
CHLORIDE: 101 mmol/L (ref 101–111)
CO2: 22 mmol/L (ref 22–32)
CREATININE: 1.33 mg/dL — AB (ref 0.44–1.00)
Calcium: 10.1 mg/dL (ref 8.9–10.3)
GFR calc non Af Amer: 36 mL/min — ABNORMAL LOW (ref >60)
GFR, EST AFRICAN AMERICAN: 41 mL/min — AB (ref >60)
Glucose, Bld: 249 mg/dL — ABNORMAL HIGH (ref 65–99)
POTASSIUM: 3.8 mmol/L (ref 3.5–5.1)
SODIUM: 136 mmol/L (ref 135–145)

## 2016-12-09 LAB — PROTIME-INR
INR: 1.22
Prothrombin Time: 15.4 seconds — ABNORMAL HIGH (ref 11.4–15.2)

## 2016-12-09 LAB — CBC WITH DIFFERENTIAL/PLATELET
BASOS ABS: 0.1 10*3/uL (ref 0.0–0.1)
Basophils Relative: 0 %
EOS ABS: 0.3 10*3/uL (ref 0.0–0.7)
EOS PCT: 1 %
HCT: 33.7 % — ABNORMAL LOW (ref 36.0–46.0)
Hemoglobin: 11.4 g/dL — ABNORMAL LOW (ref 12.0–15.0)
LYMPHS ABS: 4.9 10*3/uL — AB (ref 0.7–4.0)
LYMPHS PCT: 23 %
MCH: 31.8 pg (ref 26.0–34.0)
MCHC: 33.8 g/dL (ref 30.0–36.0)
MCV: 93.9 fL (ref 78.0–100.0)
MONO ABS: 0.8 10*3/uL (ref 0.1–1.0)
Monocytes Relative: 4 %
Neutro Abs: 14.9 10*3/uL — ABNORMAL HIGH (ref 1.7–7.7)
Neutrophils Relative %: 72 %
PLATELETS: 280 10*3/uL (ref 150–400)
RBC: 3.59 MIL/uL — ABNORMAL LOW (ref 3.87–5.11)
RDW: 13.6 % (ref 11.5–15.5)
WBC: 20.9 10*3/uL — AB (ref 4.0–10.5)

## 2016-12-09 LAB — APTT: APTT: 29 s (ref 24–36)

## 2016-12-09 MED ORDER — ACETAMINOPHEN 650 MG RE SUPP
650.0000 mg | Freq: Four times a day (QID) | RECTAL | Status: DC | PRN
  Administered 2016-12-10: 650 mg via RECTAL
  Filled 2016-12-09: qty 1

## 2016-12-09 MED ORDER — GLYCOPYRROLATE 0.2 MG/ML IJ SOLN
0.2000 mg | INTRAMUSCULAR | Status: DC | PRN
  Filled 2016-12-09: qty 1

## 2016-12-09 MED ORDER — ONDANSETRON 4 MG PO TBDP
4.0000 mg | ORAL_TABLET | Freq: Once | ORAL | Status: AC
  Administered 2016-12-09: 4 mg via ORAL
  Filled 2016-12-09: qty 1

## 2016-12-09 MED ORDER — FENTANYL CITRATE (PF) 100 MCG/2ML IJ SOLN
25.0000 ug | INTRAMUSCULAR | Status: DC | PRN
  Administered 2016-12-09 – 2016-12-10 (×2): 25 ug via INTRAVENOUS
  Filled 2016-12-09 (×2): qty 2

## 2016-12-09 MED ORDER — ONDANSETRON HCL 4 MG/2ML IJ SOLN
4.0000 mg | Freq: Once | INTRAMUSCULAR | Status: AC
  Administered 2016-12-09: 4 mg via INTRAVENOUS
  Filled 2016-12-09: qty 2

## 2016-12-09 MED ORDER — LOSARTAN POTASSIUM 50 MG PO TABS
100.0000 mg | ORAL_TABLET | Freq: Once | ORAL | Status: AC
  Administered 2016-12-09: 100 mg via ORAL
  Filled 2016-12-09: qty 2

## 2016-12-09 MED ORDER — FENTANYL CITRATE (PF) 100 MCG/2ML IJ SOLN
50.0000 ug | Freq: Once | INTRAMUSCULAR | Status: AC
  Administered 2016-12-09: 50 ug via INTRAVENOUS
  Filled 2016-12-09: qty 2

## 2016-12-09 MED ORDER — BIOTENE DRY MOUTH MT LIQD: 15.0000 mL | OROMUCOSAL | Status: DC | PRN

## 2016-12-09 MED ORDER — CLONIDINE HCL 0.1 MG PO TABS
0.1000 mg | ORAL_TABLET | Freq: Once | ORAL | Status: AC
  Administered 2016-12-09: 0.1 mg via ORAL
  Filled 2016-12-09: qty 1

## 2016-12-09 MED ORDER — ONDANSETRON HCL 4 MG/2ML IJ SOLN
4.0000 mg | Freq: Four times a day (QID) | INTRAMUSCULAR | Status: DC | PRN
Start: 2016-12-09 — End: 2016-12-11

## 2016-12-09 MED ORDER — ONDANSETRON 4 MG PO TBDP: 4.0000 mg | ORAL_TABLET | Freq: Four times a day (QID) | ORAL | Status: DC | PRN

## 2016-12-09 MED ORDER — LORAZEPAM 2 MG/ML PO CONC
1.0000 mg | ORAL | Status: DC | PRN
Start: 2016-12-09 — End: 2016-12-11

## 2016-12-09 MED ORDER — PROTHROMBIN COMPLEX CONC HUMAN 500 UNITS IV KIT
2713.0000 [IU] | PACK | INTRAVENOUS | Status: DC
  Administered 2016-12-09: 2713 [IU] via INTRAVENOUS
  Filled 2016-12-09: qty 109

## 2016-12-09 MED ORDER — LORAZEPAM 1 MG PO TABS
1.0000 mg | ORAL_TABLET | ORAL | Status: DC | PRN
Start: 2016-12-09 — End: 2016-12-11

## 2016-12-09 MED ORDER — ACETAMINOPHEN 325 MG PO TABS: 650.0000 mg | ORAL_TABLET | Freq: Four times a day (QID) | ORAL | Status: DC | PRN

## 2016-12-09 MED ORDER — LORAZEPAM 2 MG/ML IJ SOLN: 1.0000 mg | INTRAMUSCULAR | Status: DC | PRN

## 2016-12-09 MED ORDER — GLYCOPYRROLATE 1 MG PO TABS
1.0000 mg | ORAL_TABLET | ORAL | Status: DC | PRN
  Filled 2016-12-09: qty 1

## 2016-12-09 MED ORDER — LABETALOL HCL 5 MG/ML IV SOLN
10.0000 mg | Freq: Once | INTRAVENOUS | Status: AC
  Administered 2016-12-09: 10 mg via INTRAVENOUS
  Filled 2016-12-09: qty 4

## 2016-12-09 NOTE — ED Notes (Signed)
Patient transported to CT 

## 2016-12-09 NOTE — ED Notes (Signed)
Patient vomited x1

## 2016-12-09 NOTE — ED Notes (Signed)
Catapres and Cozaar not given. fpatient began vomiting when attempting to give.

## 2016-12-09 NOTE — ED Triage Notes (Signed)
Pt BIB EMS from [facility] post-mechanical fall. Patient tripped over shoe and fell. Witnesses states pt hit head. Complaint of headache. Patient is on Eliquis. BP 208/90, patient has HTN.

## 2016-12-09 NOTE — ED Notes (Signed)
Patient's family declined to have hospital Chaplain to visit.

## 2016-12-09 NOTE — H&P (Signed)
History and Physical:    Heidi George   ZOX:[id number] DOB: Dec 29, 1930 DOA: 12/09/2016  Referring MD/provider: Dr Verdie Mosher PCP: Patient, No Pcp Per   Patient coming from:   Chief Complaint: Fall with head trauma  History of Present Illness:   Heidi George is an 83 y.o. female with past medical history significant for Alzheimer's disease, atrial fibrillation on Eliquis, hypertension and diabetes who was in her usual state of health until this morning when she fell while at her nursing home. Patient was brought to the emergency room for further evaluation.  ED Course:  The patient was noted to be at her baseline mentation per her daughter on arrival. She was sent for head and neck CT. Upon return from CT scan patient was noted to have depressed consciousness. CT revealed bilateral subdural hematomas with mass effect and effacement of ventricles as well as a 10 mm midline shift. Grave implications of this finding was discussed with patient's daughter and son-in-law. Patient was DO NOT RESUSCITATE and they opted to pursue comfort care only at this time.  ROS:   ROS NA  Past Medical History:   Past Medical History:  Diagnosis Date  . Alzheimer's dementia   . Atrial fibrillation (HCC)   . Diabetes mellitus without complication (HCC)   . Hypertension   . Hypothyroidism     Past Surgical History:   Past Surgical History:  Procedure Laterality Date  . ABDOMINAL HYSTERECTOMY      Social History:   Social History   Social History  . Marital status: Single    Spouse name: N/A  . Number of children: N/A  . Years of education: N/A   Occupational History  . Not on file.   Social History Main Topics  . Smoking status: Never Smoker  . Smokeless tobacco: Never Used  . Alcohol use No  . Drug use: No  . Sexual activity: No   Other Topics Concern  . Not on file   Social History Narrative  . No narrative on file    Allergies   Promethazine  Family history:    History reviewed. No pertinent family history.  Current Medications:   Prior to Admission medications   Medication Sig Start Date End Date Taking? Authorizing Provider  apixaban (ELIQUIS) 2.5 MG TABS tablet Take 2.5 mg by mouth 2 (two) times daily.   Yes [provider]  calcium carbonate (TUMS - DOSED IN MG ELEMENTAL CALCIUM) 500 MG chewable tablet Chew 1 tablet by mouth 2 (two) times daily with a meal.   Yes [provider]  Cholecalciferol (VITAMIN D) 2000 units tablet Take 2,000 Units by mouth daily with breakfast.   Yes [provider]  cloNIDine (CATAPRES) 0.1 MG tablet Take 0.1 mg by mouth 2 (two) times daily.   Yes [provider]  donepezil (ARICEPT) 10 MG tablet Take 10 mg by mouth at bedtime.   Yes [provider]  ferrous sulfate 325 (65 FE) MG tablet Take 325 mg by mouth every Monday, Wednesday, and Friday.    Yes [provider]  HYDROcodone-acetaminophen (NORCO/VICODIN) 5-325 MG tablet Take 1 tablet by mouth every 6 (six) hours as needed for moderate pain. 06/11/16  Yes Lawyer, Cristal Deer, PA-C  levothyroxine (SYNTHROID, LEVOTHROID) 150 MCG tablet Take 150 mcg by mouth daily before breakfast.   Yes [provider]  losartan (COZAAR) 100 MG tablet Take 100 mg by mouth daily before breakfast.    Yes [provider]  memantine (NAMENDA  XR) 28 MG CP24 24 hr capsule Take 28 mg by mouth daily before breakfast.    Yes [provider]  metFORMIN (GLUCOPHAGE-XR) 500 MG 24 hr tablet Take 500 mg by mouth daily with breakfast.   Yes [provider]  spironolactone (ALDACTONE) 25 MG tablet Take 25 mg by mouth daily with breakfast.    Yes [provider]    Physical Exam:   Vitals:   12/09/16 0930 12/09/16 0945 12/09/16 1000 12/09/16 1015  BP: (!) 229/80 (!) 221/72 (!) 234/72 (!) 222/81  Pulse: (!) 53 (!) 57 (!) 54 (!) 55  Resp: 19 17 18 18   Temp:      TempSrc:      SpO2: 99% 99% 100%  100%  Weight:      Height:         Physical Exam: Blood pressure (!) 222/81, pulse (!) 55, temperature 97.9 F (36.6 C), temperature source Oral, resp. rate 18, height 5\' 1"  (1.549 m), weight 54.4 kg (120 lb), SpO2 100 %. Gen: Patient lying comfortably in bed appears to be asleep. No distress breathing about 20 times a minute with shallow respirations. No agonal breathing. The rest of the physical exam is deferred.  Data Review:    Labs: Basic Metabolic Panel:  Recent Labs Lab 12/09/16 0830  NA 136  K 3.8  CL 101  CO2 22  GLUCOSE 249*  BUN 26*  CREATININE 1.33*  CALCIUM 10.1   Liver Function Tests: No results for input(s): AST, ALT, ALKPHOS, BILITOT, PROT, ALBUMIN in the last 168 hours. No results for input(s): LIPASE, AMYLASE in the last 168 hours. No results for input(s): AMMONIA in the last 168 hours. CBC:  Recent Labs Lab 12/09/16 0830  WBC 20.9*  NEUTROABS 14.9*  HGB 11.4*  HCT 33.7*  MCV 93.9  PLT 280   Cardiac Enzymes: No results for input(s): CKTOTAL, CKMB, CKMBINDEX, TROPONINI in the last 168 hours.  BNP (last 3 results) No results for input(s): PROBNP in the last 8760 hours. CBG: No results for input(s): GLUCAP in the last 168 hours.  Urinalysis    Component Value Date/Time   COLORURINE YELLOW 03/11/2016 0149   APPEARANCEUR CLEAR 03/11/2016 0149   LABSPEC 1.009 03/11/2016 0149   PHURINE 6.0 03/11/2016 0149   GLUCOSEU NEGATIVE 03/11/2016 0149   HGBUR NEGATIVE 03/11/2016 0149   BILIRUBINUR NEGATIVE 03/11/2016 0149   KETONESUR NEGATIVE 03/11/2016 0149   PROTEINUR NEGATIVE 03/11/2016 0149   NITRITE NEGATIVE 03/11/2016 0149   LEUKOCYTESUR NEGATIVE 03/11/2016 0149      Radiographic Studies: Ct Head Wo Contrast  Result Date: 12/09/2016 CLINICAL DATA:  83 year old female status post trip and fall at assisted living center, striking head. Headache and vomiting. On Eliquis. Hypertensive on presentation, 208 systolic. EXAM: CT HEAD WITHOUT  CONTRAST CT CERVICAL SPINE WITHOUT CONTRAST TECHNIQUE: Multidetector CT imaging of the head and cervical spine was performed following the standard protocol without intravenous contrast. Multiplanar CT image reconstructions of the cervical spine were also generated. COMPARISON:  Head and cervical spine CT 06/11/2016. Brain MRI 11/08/2012. FINDINGS: CT HEAD FINDINGS Brain: Bilateral subdural hematomas, that on the right is mostly hyperdense and thicker along the anterior frontal convexity measuring up to 15 mm in thickness. Hemorrhage wraps around the right middle and anterior cranial fossa, up to 7 mm in thickness. Contralateral mixed density left subdural hematoma is smaller, mostly 4-5 mm, but focally up to 7-8 mm in thickness along the left posterior convexity, and at the floor of the  left middle cranial fossa. Mass effect on both hemispheres with leftward midline shift of 10 mm. Effaced lateral and third ventricles, but no ventriculomegaly. Gray-white matter differentiation remain stable. No cortically based acute infarct identified. The basilar cisterns remain normal. Vascular: Calcified atherosclerosis at the skull base. Skull: No skull fracture identified. Sinuses/Orbits: Visualized paranasal sinuses and mastoids are stable and well pneumatized. Other: No scalp hematoma identified. Orbits soft tissues appear stable. CT CERVICAL SPINE FINDINGS Alignment: Stable cervical vertebral height and alignment with straightening of lordosis and mild anterolisthesis at C2-C3, C3-C4, and C7-T1. Bilateral posterior element alignment is within normal limits. Skull base and vertebrae: Visualized skull base is intact. No atlanto-occipital dissociation. No cervical spine fracture identified. Soft tissues and spinal canal: No prevertebral fluid or swelling. No visible canal hematoma. Negative noncontrast neck soft tissues. Disc levels: Widespread chronic cervical disc and endplate degeneration appears stable, with a degree of  chronic degenerative cervical spinal stenosis at C5-C6, and to a lesser extent C4-C5. Upper chest: Visible upper thoracic levels appear intact. Stable lung apices. IMPRESSION: 1. Bilateral subdural hematomas, larger on the right with mass effect on both cerebral hemispheres and 10 mm of leftward midline shift. 2. Effaced lateral and third ventricles, but no ventriculomegaly. Basilar cisterns remain normal. 3. No associated skull fracture identified. No acute fracture or listhesis in the cervical spine. 4. Critical Value/emergent results were called by telephone at the time of interpretation on 12/09/2016 at 0837 hours to Dr. Crista Curb , who verbally acknowledged these results. Electronically Signed   By: Odessa Fleming M.D.   On: 12/09/2016 08:42   Ct Cervical Spine Wo Contrast  Result Date: 12/09/2016 CLINICAL DATA:  83 year old female status post trip and fall at assisted living center, striking head. Headache and vomiting. On Eliquis. Hypertensive on presentation, 208 systolic. EXAM: CT HEAD WITHOUT CONTRAST CT CERVICAL SPINE WITHOUT CONTRAST TECHNIQUE: Multidetector CT imaging of the head and cervical spine was performed following the standard protocol without intravenous contrast. Multiplanar CT image reconstructions of the cervical spine were also generated. COMPARISON:  Head and cervical spine CT 06/11/2016. Brain MRI 11/08/2012. FINDINGS: CT HEAD FINDINGS Brain: Bilateral subdural hematomas, that on the right is mostly hyperdense and thicker along the anterior frontal convexity measuring up to 15 mm in thickness. Hemorrhage wraps around the right middle and anterior cranial fossa, up to 7 mm in thickness. Contralateral mixed density left subdural hematoma is smaller, mostly 4-5 mm, but focally up to 7-8 mm in thickness along the left posterior convexity, and at the floor of the left middle cranial fossa. Mass effect on both hemispheres with leftward midline shift of 10 mm. Effaced lateral and third ventricles, but  no ventriculomegaly. Gray-white matter differentiation remain stable. No cortically based acute infarct identified. The basilar cisterns remain normal. Vascular: Calcified atherosclerosis at the skull base. Skull: No skull fracture identified. Sinuses/Orbits: Visualized paranasal sinuses and mastoids are stable and well pneumatized. Other: No scalp hematoma identified. Orbits soft tissues appear stable. CT CERVICAL SPINE FINDINGS Alignment: Stable cervical vertebral height and alignment with straightening of lordosis and mild anterolisthesis at C2-C3, C3-C4, and C7-T1. Bilateral posterior element alignment is within normal limits. Skull base and vertebrae: Visualized skull base is intact. No atlanto-occipital dissociation. No cervical spine fracture identified. Soft tissues and spinal canal: No prevertebral fluid or swelling. No visible canal hematoma. Negative noncontrast neck soft tissues. Disc levels: Widespread chronic cervical disc and endplate degeneration appears stable, with a degree of chronic degenerative cervical spinal stenosis at C5-C6, and  to a lesser extent C4-C5. Upper chest: Visible upper thoracic levels appear intact. Stable lung apices. IMPRESSION: 1. Bilateral subdural hematomas, larger on the right with mass effect on both cerebral hemispheres and 10 mm of leftward midline shift. 2. Effaced lateral and third ventricles, but no ventriculomegaly. Basilar cisterns remain normal. 3. No associated skull fracture identified. No acute fracture or listhesis in the cervical spine. 4. Critical Value/emergent results were called by telephone at the time of interpretation on 12/09/2016 at 0837 hours to Dr. Crista CurbANA LIU , who verbally acknowledged these results. Electronically Signed   By: Odessa FlemingH  Hall M.D.   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 Family had many questions about the physiology  of what was happening which I answered. They're aware that she will die in the next several hours. They have contacted the funeral home and their pastor. However [facility] as they're not sure of their pastor will be able to visit today. Comfort care order set entered.  Chaplain's office was called and a message was left on the answering machine. I have informed the family that I am available if they have any questions or concerns.   Body mass index is 22.67 kg/m.  Other information:   DVT prophylaxis: NA Code Status: DNR Family Communication: Patient's daughter and son-in-law were in the room I spoke with them extensively about comfort care measures and let them know that I was available if they needed anything.  Disposition Plan: funeral home arrangements have been made Consults called: none Admission status: observation MedSurg  The medical decision making is of moderate complexity, therefore this is a level 2 visit.  Horatio PelSrobona Orma [place] Kamiyah Kindel [facility] Pager [phone] Cell: [phone]   If 7PM-7AM, please contact night-coverage [email or web address] Password [email or web address] 12/09/2016, 10:36 AM

## 2016-12-09 NOTE — ED Provider Notes (Signed)
[facility]-EMERGENCY DEPT Provider Note   CSN: [id number] Arrival date & time: 12/09/16  4098     History   Chief Complaint Chief Complaint  Patient presents with  . Fall  . Headache    +Blood thinners    HPI Heidi George is a 83 y.o. female.  The history is provided by the patient.  Fall  This is a new problem. The current episode started 1 to 2 hours ago. The problem occurs rarely. The problem has been resolved. Nothing aggravates the symptoms. Nothing relieves the symptoms. She has tried nothing for the symptoms. The treatment provided no relief.   83 year old female who presents after mechanical fall. History of alzheimer's dementia, PAF on eliquis, HTN, and DM. Per patient's daughter, she had witnessed fall today at her nursing home. Tripped over shoe while walking and fell with head strike. Unknown loss of consciousness. Did once complain of headache over the weekend, but currently denies any headache or pain. Denies chest pain, dyspnea, abdominal pain. She has baseline mentation per daughter. Has not recently been ill.    Past Medical History:  Diagnosis Date  . Alzheimer's dementia   . Atrial fibrillation (HCC)   . Diabetes mellitus without complication (HCC)   . Hypertension   . Hypothyroidism     Patient Active Problem List   Diagnosis Date Noted  . Closed displaced comminuted fracture of shaft of right humerus 06/15/2016  . Diverticulitis 03/06/2016  . HTN (hypertension) 03/06/2016  . Diabetes (HCC) 03/06/2016  . Hypothyroidism 03/06/2016  . Acute diverticulitis 03/06/2016  . Diverticulitis of large intestine with perforation   . Atrial fibrillation with controlled ventricular response (HCC) 11/25/2014    History reviewed. No pertinent surgical history.  OB History    No data available       Home Medications    Prior to Admission medications   Medication Sig Start Date End Date Taking? Authorizing Provider  apixaban (ELIQUIS) 2.5 MG TABS tablet Take  2.5 mg by mouth 2 (two) times daily.   Yes [provider]  calcium carbonate (TUMS - DOSED IN MG ELEMENTAL CALCIUM) 500 MG chewable tablet Chew 1 tablet by mouth 2 (two) times daily with a meal.   Yes [provider]  Cholecalciferol (VITAMIN D) 2000 units tablet Take 2,000 Units by mouth daily with breakfast.   Yes [provider]  cloNIDine (CATAPRES) 0.1 MG tablet Take 0.1 mg by mouth 2 (two) times daily.   Yes [provider]  donepezil (ARICEPT) 10 MG tablet Take 10 mg by mouth at bedtime.   Yes [provider]  ferrous sulfate 325 (65 FE) MG tablet Take 325 mg by mouth every Monday, Wednesday, and Friday.    Yes [provider]  HYDROcodone-acetaminophen (NORCO/VICODIN) 5-325 MG tablet Take 1 tablet by mouth every 6 (six) hours as needed for moderate pain. 06/11/16  Yes Lawyer, Cristal Deer, PA-C  levothyroxine (SYNTHROID, LEVOTHROID) 150 MCG tablet Take 150 mcg by mouth daily before breakfast.   Yes [provider]  losartan (COZAAR) 100 MG tablet Take 100 mg by mouth daily before breakfast.    Yes [provider]  memantine (NAMENDA XR) 28 MG CP24 24 hr capsule Take 28 mg by mouth daily before breakfast.    Yes [provider]  metFORMIN (GLUCOPHAGE-XR) 500 MG 24 hr tablet Take 500 mg by mouth daily with breakfast.   Yes [provider]  spironolactone (ALDACTONE) 25 MG tablet Take 25 mg by mouth daily with  breakfast.    Yes [provider]    Family History History reviewed. No pertinent family history.  Social History Social History  Substance Use Topics  . Smoking status: Unknown If Ever Smoked  . Smokeless tobacco: Never Used  . Alcohol use No     Allergies   Promethazine   Review of Systems Review of Systems  Unable to perform ROS: Dementia     Physical Exam Updated Vital Signs BP (!) 234/56   Pulse (!) 54   Temp 97.9 F (36.6 C) (Oral)   Resp (!) 23   Ht 5\' 1"   (1.549 m)   Wt 54.4 kg (120 lb)   SpO2 97%   BMI 22.67 kg/m   Physical Exam Physical Exam  Nursing note and vitals reviewed. Constitutional: Well developed, well nourished, non-toxic, and in no acute distress Head: Normocephalic and atraumatic.  Mouth/Throat: Oropharynx is clear and moist.  Neck: Normal range of motion. Neck supple. no cervical spine tenderness Cardiovascular: Normal rate and regular rhythm.   Pulmonary/Chest: Effort normal and breath sounds normal.  Abdominal: Soft. There is no tenderness. There is no rebound and no guarding.  Musculoskeletal: Normal range of motion.  Neurological: Alert, no facial droop, PERRL, equal bilateral hand grip, antigravity with RLE, weakness of LLE (unable to hold against gravity), sensation to light touch in tact in all 4 extremities and face Skin: Skin is warm and dry.  Psychiatric: Cooperative   ED Treatments / Results  Labs (all labs ordered are listed, but only abnormal results are displayed) Labs Reviewed  CBC WITH DIFFERENTIAL/PLATELET  BASIC METABOLIC PANEL  PROTIME-INR  APTT    EKG  EKG Interpretation None       Radiology Ct Head Wo Contrast  Result Date: 12/09/2016 CLINICAL DATA:  83 year old female status post trip and fall at assisted living center, striking head. Headache and vomiting. On Eliquis. Hypertensive on presentation, 208 systolic. EXAM: CT HEAD WITHOUT CONTRAST CT CERVICAL SPINE WITHOUT CONTRAST TECHNIQUE: Multidetector CT imaging of the head and cervical spine was performed following the standard protocol without intravenous contrast. Multiplanar CT image reconstructions of the cervical spine were also generated. COMPARISON:  Head and cervical spine CT 06/11/2016. Brain MRI 11/08/2012. FINDINGS: CT HEAD FINDINGS Brain: Bilateral subdural hematomas, that on the right is mostly hyperdense and thicker along the anterior frontal convexity measuring up to 15 mm in thickness. Hemorrhage wraps around the right  middle and anterior cranial fossa, up to 7 mm in thickness. Contralateral mixed density left subdural hematoma is smaller, mostly 4-5 mm, but focally up to 7-8 mm in thickness along the left posterior convexity, and at the floor of the left middle cranial fossa. Mass effect on both hemispheres with leftward midline shift of 10 mm. Effaced lateral and third ventricles, but no ventriculomegaly. Gray-white matter differentiation remain stable. No cortically based acute infarct identified. The basilar cisterns remain normal. Vascular: Calcified atherosclerosis at the skull base. Skull: No skull fracture identified. Sinuses/Orbits: Visualized paranasal sinuses and mastoids are stable and well pneumatized. Other: No scalp hematoma identified. Orbits soft tissues appear stable. CT CERVICAL SPINE FINDINGS Alignment: Stable cervical vertebral height and alignment with straightening of lordosis and mild anterolisthesis at C2-C3, C3-C4, and C7-T1. Bilateral posterior element alignment is within normal limits. Skull base and vertebrae: Visualized skull base is intact. No atlanto-occipital dissociation. No cervical spine fracture identified. Soft tissues and spinal canal: No prevertebral fluid or swelling. No visible canal hematoma. Negative noncontrast neck soft tissues. Disc levels: Widespread chronic  cervical disc and endplate degeneration appears stable, with a degree of chronic degenerative cervical spinal stenosis at C5-C6, and to a lesser extent C4-C5. Upper chest: Visible upper thoracic levels appear intact. Stable lung apices. IMPRESSION: 1. Bilateral subdural hematomas, larger on the right with mass effect on both cerebral hemispheres and 10 mm of leftward midline shift. 2. Effaced lateral and third ventricles, but no ventriculomegaly. Basilar cisterns remain normal. 3. No associated skull fracture identified. No acute fracture or listhesis in the cervical spine. 4. Critical Value/emergent results were called by  telephone at the time of interpretation on 12/09/2016 at 0837 hours to Dr. Crista Curb , who verbally acknowledged these results. Electronically Signed   By: Odessa Fleming M.D.   On: 12/09/2016 08:42   Ct Cervical Spine Wo Contrast  Result Date: 12/09/2016 CLINICAL DATA:  83 year old female status post trip and fall at assisted living center, striking head. Headache and vomiting. On Eliquis. Hypertensive on presentation, 208 systolic. EXAM: CT HEAD WITHOUT CONTRAST CT CERVICAL SPINE WITHOUT CONTRAST TECHNIQUE: Multidetector CT imaging of the head and cervical spine was performed following the standard protocol without intravenous contrast. Multiplanar CT image reconstructions of the cervical spine were also generated. COMPARISON:  Head and cervical spine CT 06/11/2016. Brain MRI 11/08/2012. FINDINGS: CT HEAD FINDINGS Brain: Bilateral subdural hematomas, that on the right is mostly hyperdense and thicker along the anterior frontal convexity measuring up to 15 mm in thickness. Hemorrhage wraps around the right middle and anterior cranial fossa, up to 7 mm in thickness. Contralateral mixed density left subdural hematoma is smaller, mostly 4-5 mm, but focally up to 7-8 mm in thickness along the left posterior convexity, and at the floor of the left middle cranial fossa. Mass effect on both hemispheres with leftward midline shift of 10 mm. Effaced lateral and third ventricles, but no ventriculomegaly. Gray-white matter differentiation remain stable. No cortically based acute infarct identified. The basilar cisterns remain normal. Vascular: Calcified atherosclerosis at the skull base. Skull: No skull fracture identified. Sinuses/Orbits: Visualized paranasal sinuses and mastoids are stable and well pneumatized. Other: No scalp hematoma identified. Orbits soft tissues appear stable. CT CERVICAL SPINE FINDINGS Alignment: Stable cervical vertebral height and alignment with straightening of lordosis and mild anterolisthesis at C2-C3,  C3-C4, and C7-T1. Bilateral posterior element alignment is within normal limits. Skull base and vertebrae: Visualized skull base is intact. No atlanto-occipital dissociation. No cervical spine fracture identified. Soft tissues and spinal canal: No prevertebral fluid or swelling. No visible canal hematoma. Negative noncontrast neck soft tissues. Disc levels: Widespread chronic cervical disc and endplate degeneration appears stable, with a degree of chronic degenerative cervical spinal stenosis at C5-C6, and to a lesser extent C4-C5. Upper chest: Visible upper thoracic levels appear intact. Stable lung apices. IMPRESSION: 1. Bilateral subdural hematomas, larger on the right with mass effect on both cerebral hemispheres and 10 mm of leftward midline shift. 2. Effaced lateral and third ventricles, but no ventriculomegaly. Basilar cisterns remain normal. 3. No associated skull fracture identified. No acute fracture or listhesis in the cervical spine. 4. Critical Value/emergent results were called by telephone at the time of interpretation on 12/09/2016 at 0837 hours to Dr. Crista Curb , who verbally acknowledged these results. Electronically Signed   By: Odessa Fleming M.D.   On: 12/09/2016 08:42    Procedures Procedures (including critical care time) CRITICAL CARE Performed by: Lavera Guise   Total critical care time: 40 minutes  Critical care time was exclusive of separately billable procedures and  treating other patients.  Critical care was necessary to treat or prevent imminent or life-threatening deterioration.  Critical care was time spent personally by me on the following activities: development of treatment plan with patient and/or surrogate as well as nursing, discussions with consultants, evaluation of patient's response to treatment, examination of patient, obtaining history from patient or surrogate, ordering and performing treatments and interventions, ordering and review of laboratory studies, ordering  and review of radiographic studies, pulse oximetry and re-evaluation of patient's condition.  Medications Ordered in ED Medications  acetaminophen (TYLENOL) tablet 650 mg (not administered)    Or  acetaminophen (TYLENOL) suppository 650 mg (not administered)  fentaNYL (SUBLIMAZE) injection 25 mcg (not administered)  LORazepam (ATIVAN) tablet 1 mg (not administered)    Or  LORazepam (ATIVAN) 2 MG/ML concentrated solution 1 mg (not administered)    Or  LORazepam (ATIVAN) injection 1 mg (not administered)  ondansetron (ZOFRAN-ODT) disintegrating tablet 4 mg (not administered)    Or  ondansetron (ZOFRAN) injection 4 mg (not administered)  glycopyrrolate (ROBINUL) tablet 1 mg (not administered)    Or  glycopyrrolate (ROBINUL) injection 0.2 mg (not administered)    Or  glycopyrrolate (ROBINUL) injection 0.2 mg (not administered)  LORazepam (ATIVAN) injection 1 mg (not administered)  antiseptic oral rinse (BIOTENE) solution 15 mL (not administered)  cloNIDine (CATAPRES) tablet 0.1 mg (0.1 mg Oral Given 12/09/16 0732)  losartan (COZAAR) tablet 100 mg (100 mg Oral Given 12/09/16 0736)  ondansetron (ZOFRAN-ODT) disintegrating tablet 4 mg (4 mg Oral Given 12/09/16 0752)  labetalol (NORMODYNE,TRANDATE) injection 10 mg (10 mg Intravenous Given 12/09/16 0844)  ondansetron (ZOFRAN) injection 4 mg (4 mg Intravenous Given 12/09/16 0843)  fentaNYL (SUBLIMAZE) injection 50 mcg (50 mcg Intravenous Given 12/09/16 0859)     Initial Impression / Assessment and Plan / ED Course  I have reviewed the triage vital signs and the nursing notes.  Pertinent labs & imaging results that were available during my care of the patient were reviewed by me and considered in my medical decision making (see chart for details).     83 year old female who presents with a mechanical fall. Initially mentating normally, but having difficulty moving the left lower extremity. She is extremely hypertensive here.  CT head visualized and  shows bilateral subdural hematomas right greater than left with significant mass effect and midline shift. On return from CT, she is now nonverbal, minimally responsive, and appears to be in significant decline. Family does confirm that she is DO NOT RESUSCITATE. Initially was ordered Armandina Stammer for reversal and a neurosurgery consult is placed. However under further discussion with patient's family they feel that the best course of action is to make her comfort care. Discussed plan with Dr. Yetta Barre from neurosurgery.   Comfort care orders placed. Patient to be admitted to medicine service.     Final Clinical Impressions(s) / ED Diagnoses   Final diagnoses:  Subdural hematoma [facility])    New Prescriptions New Prescriptions   No medications on file     Lavera Guise, MD 12/09/16 [phone]

## 2016-12-10 DIAGNOSIS — R509 Fever, unspecified: Secondary | ICD-10-CM | POA: Diagnosis not present

## 2016-12-10 DIAGNOSIS — E1159 Type 2 diabetes mellitus with other circulatory complications: Secondary | ICD-10-CM | POA: Diagnosis not present

## 2016-12-10 DIAGNOSIS — F0390 Unspecified dementia without behavioral disturbance: Secondary | ICD-10-CM

## 2016-12-10 DIAGNOSIS — I6200 Nontraumatic subdural hemorrhage, unspecified: Secondary | ICD-10-CM

## 2016-12-10 DIAGNOSIS — N179 Acute kidney failure, unspecified: Secondary | ICD-10-CM | POA: Diagnosis present

## 2016-12-10 DIAGNOSIS — W19XXXA Unspecified fall, initial encounter: Secondary | ICD-10-CM | POA: Diagnosis present

## 2016-12-10 DIAGNOSIS — I10 Essential (primary) hypertension: Secondary | ICD-10-CM | POA: Diagnosis not present

## 2016-12-10 DIAGNOSIS — D72829 Elevated white blood cell count, unspecified: Secondary | ICD-10-CM | POA: Diagnosis present

## 2016-12-10 DIAGNOSIS — G935 Compression of brain: Secondary | ICD-10-CM | POA: Diagnosis not present

## 2016-12-10 DIAGNOSIS — S065XAA Traumatic subdural hemorrhage with loss of consciousness status unknown, initial encounter: Secondary | ICD-10-CM

## 2016-12-10 DIAGNOSIS — S065X9A Traumatic subdural hemorrhage with loss of consciousness of unspecified duration, initial encounter: Secondary | ICD-10-CM

## 2016-12-10 NOTE — Progress Notes (Signed)
PROGRESS NOTE    Heidi George  ZOX:[id number] DOB: November 02, 1930 DOA: 12/09/2016 PCP: Patient, No Pcp Per (Confirm with patient/family/NH records and if not entered, this HAS to be entered at [facility] point of entry. "No PCP" if truly none.)   Brief Narrative:  83 yo F with alzheimer's, afib on eliquis, HTN, and DM who presented after Mell Guia fall on anticoagulation at her nursing home on 8/9.  She was brought to the ED for further evaluation and was initially at her baseline mental status, but was noted to have depressed consciousness after the CT scan.  She had Irvan Tiedt CT with bilateral subdural hematomas (larger on the R with mass effect and 10 mm L midline shift), effaced lateral and 3rd ventricles.  After discussion with ED and neurosurgery, planned for comfort care.     Assessment & Plan:   Active Problems:   Herniation of brain stem (HCC)   Subdural Hematoma, comfort care- Bilateral subdural hematomas with mass effect and midline shift.  Hypertensive and bradycardic, likely due to increased ICP. After conversation with ED/neurosurgery, decision was made for the patient to be comfort care.  Comfort care orders placed.  Patient is from the [facility] and his daughter noted that they inquired whether she would be returning on hospice.  Care management consult placed to discuss hospice services.  The family had many questions this morning about prognosis and time course (were under the impression that she would probably pass away last night).  I discussed that this is something very difficult to predict.  She has Khamani Daniely poor prognosis ultimately and hospice would be appropriate.  Will ultimately plan for hospice and if able Sophea Rackham transfer back to [facility].  Fever - Did have fever this morning, which could be central fever.  As patient comfort care, will not pursue w/u.  Discuss limiting vitals.     DVT prophylaxis: none Code Status: DNR Family Communication: Discussed with son/daughter at  bedside Disposition Plan: Likely discharge on hospice, pending referral.   Consultants:   Care management for hospice  Procedures:   none  Antimicrobials:   none    Subjective: Family with many questions about prognosis and time course.  Asking about hospice/palliative care consult.    Objective: Vitals:   12/09/16 1130 12/09/16 1223 12/09/16 2021 12/10/16 0828  BP: (!) 246/69 (!) 164/79 (!) 188/64 (!) 172/70  Pulse: (!) 49 (!) 51 (!) 56 96  Resp: (!) 21 20 20 20   Temp:  98.2 F (36.8 C) 98 F (36.7 C) (!) 101.6 F (38.7 C)  TempSrc:  Oral Axillary Axillary  SpO2: 99% 100% 100% 93%  Weight:      Height:       No intake or output data in the 24 hours ending 12/10/16 1103 Filed Weights   12/09/16 8119  Weight: 54.4 kg (120 lb)    Examination:  General exam: Minimally responsive to pain, breathing deeply Respiratory system: deep respirations Cardiovascular system: S1 & S2 heard, RRR. No pedal edema. Gastrointestinal system: Abdomen is nondistended, soft and nontender.  Central nervous system: Slight grimace with sternal rub.  Pupils ~3 mm with minimal response.   Extremities: No LEE Skin: No rashes, lesions or ulcers Psychiatry: Unresponsive     Data Reviewed: I have personally reviewed following labs and imaging studies  CBC:  Recent Labs Lab 12/09/16 0830  WBC 20.9*  NEUTROABS 14.9*  HGB 11.4*  HCT 33.7*  MCV 93.9  PLT 280   Basic Metabolic Panel:  Recent  Labs Lab 12/09/16 0830  NA 136  K 3.8  CL 101  CO2 22  GLUCOSE 249*  BUN 26*  CREATININE 1.33*  CALCIUM 10.1   GFR: Estimated Creatinine Clearance: 23.8 mL/min (Keyarra Rendall) (by C-G formula based on SCr of 1.33 mg/dL (H)). Liver Function Tests: No results for input(s): AST, ALT, ALKPHOS, BILITOT, PROT, ALBUMIN in the last 168 hours. No results for input(s): LIPASE, AMYLASE in the last 168 hours. No results for input(s): AMMONIA in the last 168 hours. Coagulation Profile:  Recent  Labs Lab 12/09/16 0830  INR 1.22   Cardiac Enzymes: No results for input(s): CKTOTAL, CKMB, CKMBINDEX, TROPONINI in the last 168 hours. BNP (last 3 results) No results for input(s): PROBNP in the last 8760 hours. HbA1C: No results for input(s): HGBA1C in the last 72 hours. CBG: No results for input(s): GLUCAP in the last 168 hours. Lipid Profile: No results for input(s): CHOL, HDL, LDLCALC, TRIG, CHOLHDL, LDLDIRECT in the last 72 hours. Thyroid Function Tests: No results for input(s): TSH, T4TOTAL, FREET4, T3FREE, THYROIDAB in the last 72 hours. Anemia Panel: No results for input(s): VITAMINB12, FOLATE, FERRITIN, TIBC, IRON, RETICCTPCT in the last 72 hours. Sepsis Labs: No results for input(s): PROCALCITON, LATICACIDVEN in the last 168 hours.  No results found for this or any previous visit (from the past 240 hour(s)).       Radiology Studies: Ct Head Wo Contrast  Result Date: 12/09/2016 CLINICAL DATA:  83 year old female status post trip and fall at assisted living center, striking head. Headache and vomiting. On Eliquis. Hypertensive on presentation, 208 systolic. EXAM: CT HEAD WITHOUT CONTRAST CT CERVICAL SPINE WITHOUT CONTRAST TECHNIQUE: Multidetector CT imaging of the head and cervical spine was performed following the standard protocol without intravenous contrast. Multiplanar CT image reconstructions of the cervical spine were also generated. COMPARISON:  Head and cervical spine CT 06/11/2016. Brain MRI 11/08/2012. FINDINGS: CT HEAD FINDINGS Brain: Bilateral subdural hematomas, that on the right is mostly hyperdense and thicker along the anterior frontal convexity measuring up to 15 mm in thickness. Hemorrhage wraps around the right middle and anterior cranial fossa, up to 7 mm in thickness. Contralateral mixed density left subdural hematoma is smaller, mostly 4-5 mm, but focally up to 7-8 mm in thickness along the left posterior convexity, and at the floor of the left middle  cranial fossa. Mass effect on both hemispheres with leftward midline shift of 10 mm. Effaced lateral and third ventricles, but no ventriculomegaly. Gray-white matter differentiation remain stable. No cortically based acute infarct identified. The basilar cisterns remain normal. Vascular: Calcified atherosclerosis at the skull base. Skull: No skull fracture identified. Sinuses/Orbits: Visualized paranasal sinuses and mastoids are stable and well pneumatized. Other: No scalp hematoma identified. Orbits soft tissues appear stable. CT CERVICAL SPINE FINDINGS Alignment: Stable cervical vertebral height and alignment with straightening of lordosis and mild anterolisthesis at C2-C3, C3-C4, and C7-T1. Bilateral posterior element alignment is within normal limits. Skull base and vertebrae: Visualized skull base is intact. No atlanto-occipital dissociation. No cervical spine fracture identified. Soft tissues and spinal canal: No prevertebral fluid or swelling. No visible canal hematoma. Negative noncontrast neck soft tissues. Disc levels: Widespread chronic cervical disc and endplate degeneration appears stable, with Jaleesa Cervi degree of chronic degenerative cervical spinal stenosis at C5-C6, and to Yoan Sallade lesser extent C4-C5. Upper chest: Visible upper thoracic levels appear intact. Stable lung apices. IMPRESSION: 1. Bilateral subdural hematomas, larger on the right with mass effect on both cerebral hemispheres and 10 mm of leftward midline  shift. 2. Effaced lateral and third ventricles, but no ventriculomegaly. Basilar cisterns remain normal. 3. No associated skull fracture identified. No acute fracture or listhesis in the cervical spine. 4. Critical Value/emergent results were called by telephone at the time of interpretation on 12/09/2016 at 0837 hours to Dr. Crista CurbANA LIU , who verbally acknowledged these results. Electronically Signed   By: Odessa FlemingH  Hall M.D.   On: 12/09/2016 08:42   Ct Cervical Spine Wo Contrast  Result Date:  12/09/2016 CLINICAL DATA:  83 year old female status post trip and fall at assisted living center, striking head. Headache and vomiting. On Eliquis. Hypertensive on presentation, 208 systolic. EXAM: CT HEAD WITHOUT CONTRAST CT CERVICAL SPINE WITHOUT CONTRAST TECHNIQUE: Multidetector CT imaging of the head and cervical spine was performed following the standard protocol without intravenous contrast. Multiplanar CT image reconstructions of the cervical spine were also generated. COMPARISON:  Head and cervical spine CT 06/11/2016. Brain MRI 11/08/2012. FINDINGS: CT HEAD FINDINGS Brain: Bilateral subdural hematomas, that on the right is mostly hyperdense and thicker along the anterior frontal convexity measuring up to 15 mm in thickness. Hemorrhage wraps around the right middle and anterior cranial fossa, up to 7 mm in thickness. Contralateral mixed density left subdural hematoma is smaller, mostly 4-5 mm, but focally up to 7-8 mm in thickness along the left posterior convexity, and at the floor of the left middle cranial fossa. Mass effect on both hemispheres with leftward midline shift of 10 mm. Effaced lateral and third ventricles, but no ventriculomegaly. Gray-white matter differentiation remain stable. No cortically based acute infarct identified. The basilar cisterns remain normal. Vascular: Calcified atherosclerosis at the skull base. Skull: No skull fracture identified. Sinuses/Orbits: Visualized paranasal sinuses and mastoids are stable and well pneumatized. Other: No scalp hematoma identified. Orbits soft tissues appear stable. CT CERVICAL SPINE FINDINGS Alignment: Stable cervical vertebral height and alignment with straightening of lordosis and mild anterolisthesis at C2-C3, C3-C4, and C7-T1. Bilateral posterior element alignment is within normal limits. Skull base and vertebrae: Visualized skull base is intact. No atlanto-occipital dissociation. No cervical spine fracture identified. Soft tissues and spinal  canal: No prevertebral fluid or swelling. No visible canal hematoma. Negative noncontrast neck soft tissues. Disc levels: Widespread chronic cervical disc and endplate degeneration appears stable, with Russie Gulledge degree of chronic degenerative cervical spinal stenosis at C5-C6, and to Trayveon Beckford lesser extent C4-C5. Upper chest: Visible upper thoracic levels appear intact. Stable lung apices. IMPRESSION: 1. Bilateral subdural hematomas, larger on the right with mass effect on both cerebral hemispheres and 10 mm of leftward midline shift. 2. Effaced lateral and third ventricles, but no ventriculomegaly. Basilar cisterns remain normal. 3. No associated skull fracture identified. No acute fracture or listhesis in the cervical spine. 4. Critical Value/emergent results were called by telephone at the time of interpretation on 12/09/2016 at 0837 hours to Dr. Crista CurbANA LIU , who verbally acknowledged these results. Electronically Signed   By: Odessa FlemingH  Hall M.D.   On: 12/09/2016 08:42        Scheduled Meds: Continuous Infusions:   LOS: 0 days    Time spent: 35 minutes    Mannix Kroeker Grier Mittsaldwell Powell Jr, MD [facility] Pager [phone]  If 7PM-7AM, please contact night-coverage [email or web address] Password [email or web address] 12/10/2016, 11:03 AM

## 2016-12-10 NOTE — Progress Notes (Signed)
Plan[facility] room C5 Report # for RN: [phone]

## 2016-12-10 NOTE — Care Management Note (Signed)
Case Management Note  Patient Details  Name: Heidi AugustaSybil George MRN: [id number] Date of Birth: 01-07-1931  Subjective/Objective: Per CSW from ALF-[facility].                   Action/Plan:ALF   Expected Discharge Date:   (UNKNOWN)               Expected Discharge Plan:  Assisted Living / Rest Home  In-House Referral:  Clinical Social Work  Discharge planning Services  CM Consult  Post Acute Care Choice:    Choice offered to:     DME Arranged:    DME Agency:     HH Arranged:    HH Agency:     Status of Service:  In process, will continue to follow  If discussed at Long Length of Stay Meetings, dates discussed:    Additional Comments:  [name], Mayo Faulk, RN 12/10/2016, 8:40 AM

## 2016-12-10 NOTE — Care Management Note (Signed)
Case Management Note  Patient Details  Name: Heidi George MRN: [id number] Date of Birth: 02-Sep-1930  Subjective/Objective:  83 y/o f admitted w/Subdural hematomas. DNR. From [facility]. Noted-comfort care,funeral arrangements already made. CSW/CM following.                  Action/Plan:SNF   Expected Discharge Date:   (UNKNOWN)               Expected Discharge Plan:  Skilled Nursing Facility  In-House Referral:  Clinical Social Work  Discharge planning Services  CM Consult  Post Acute Care Choice:    Choice offered to:     DME Arranged:    DME Agency:     HH Arranged:    HH Agency:     Status of Service:  In process, will continue to follow  If discussed at Long Length of Stay Meetings, dates discussed:    Additional Comments:  [name], [name], RN 12/10/2016, 8:37 AM

## 2016-12-10 NOTE — Progress Notes (Signed)
[facility]) - Hospital Liaison: RN visit  Notified by Olegario MessierKathy, [facility]irmaryCMRN, of family request for [facility] at [facility] after discharge.  Chart and patient information under review by [facility] physician.  Hospice eligibility pending at this time.   Writer spoke with Alean RinneGail Marcott, over the phone, to initiate education related to hospice philosophy, services and team approach to care.  Family verbalized understanding of the information given.  Per discussion, plans is for [facility] by Sharin MonsPTAR 12/10/16.  West BaliMary Anne, RN, went to bedside to see patient and family.    Please send signed and completed DNR form home with patient.    DME needs have been discussed, patient currently has the following equipment at home:  None.  Family requests the following DME for delivery to the facility:  Hospital bed.  [facility] equipment manager has been notified and will contact [facility] to arrange delivery to the facility.  Facility address is [address], [place], [place] [address].  Alean RinneGail Houchin, daughter, is the contact person at [phone].  [facility] Referral Center aware of the above.  Completed discharge summary will need to be faxed to [facility] at [phone], when final.  Please notify [facility] when patient is ready to leave the unit at discharge.  (Call [phone] or [phone] after 5pm).  [facility] information and contact numbers given to Alean RinneGail Carmer during visit.  Above information shared with Olegario MessierKathy, [facility]RN.  Please call with any hospice related questions.  Thank you for the referral.   Adele BarthelAmy Evans, RN, BSN [facility] Hospital Liaison [phone]  All hospital liaisons are now on AMION.

## 2016-12-10 NOTE — NC FL2 (Signed)
[place] MEDICAID FL2 LEVEL OF CARE SCREENING TOOL     IDENTIFICATION  Patient Name: Heidi AugustaSybil Deneault Birthdate: Jan 23, 1931 Sex: female Admission Date (Current Location): 12/09/2016  [facility] [place]   Facility and Address:  [facility],  [address], [place] [address]      Provider Number: [id number]  Attending Physician Name and Address:  Nancy NordmannPowell, Alvin C Jr., MD  Relative Name and Phone Number:       Current Level of Care: Hospital Recommended Level of Care: Assisted Living Facility, Memory Care Prior Approval Number:    Date Approved/Denied:   PASRR Number:    Discharge Plan: Home (ALF memory care unit)    Current Diagnoses: Patient Active Problem List   Diagnosis Date Noted  . Subdural hematoma (HCC) 12/10/2016  . Herniation of brain stem (HCC) 12/09/2016  . Closed displaced comminuted fracture of shaft of right humerus 06/15/2016  . Diverticulitis 03/06/2016  . HTN (hypertension) 03/06/2016  . Diabetes (HCC) 03/06/2016  . Hypothyroidism 03/06/2016  . Acute diverticulitis 03/06/2016  . Diverticulitis of large intestine with perforation   . Atrial fibrillation with controlled ventricular response (HCC) 11/25/2014    Orientation RESPIRATION BLADDER Height & Weight        Normal Incontinent Weight: 120 lb (54.4 kg) Height:  5\' 1"  (154.9 cm)  BEHAVIORAL SYMPTOMS/MOOD NEUROLOGICAL BOWEL NUTRITION STATUS      Incontinent Diet (regular)  AMBULATORY STATUS COMMUNICATION OF NEEDS Skin   Total Care Verbally Normal                       Personal Care Assistance Level of Assistance  Total care           Functional Limitations Info             SPECIAL CARE FACTORS FREQUENCY                       Contractures Contractures Info: Not present    Additional Factors Info  Code Status, Allergies Code Status Info: DNR Allergies Info: Promethazine           Current Medications (12/10/2016):  This is the  current hospital active medication list Current Facility-Administered Medications  Medication Dose Route Frequency Provider Last Rate Last Dose  . acetaminophen (TYLENOL) tablet 650 mg  650 mg Oral Q6H PRN Lavera GuiseLiu, Dana Duo, MD       Or  . acetaminophen (TYLENOL) suppository 650 mg  650 mg Rectal Q6H PRN Lavera GuiseLiu, Dana Duo, MD   650 mg at 12/10/16 1122  . antiseptic oral rinse (BIOTENE) solution 15 mL  15 mL Topical PRN Lavera GuiseLiu, Dana Duo, MD      . fentaNYL (SUBLIMAZE) injection 25 mcg  25 mcg Intravenous Q30 min PRN Lavera GuiseLiu, Dana Duo, MD   25 mcg at 12/09/16 1822  . glycopyrrolate (ROBINUL) tablet 1 mg  1 mg Oral Q4H PRN Lavera GuiseLiu, Dana Duo, MD       Or  . glycopyrrolate (ROBINUL) injection 0.2 mg  0.2 mg Subcutaneous Q4H PRN Lavera GuiseLiu, Dana Duo, MD       Or  . glycopyrrolate (ROBINUL) injection 0.2 mg  0.2 mg Intravenous Q4H PRN Lavera GuiseLiu, Dana Duo, MD      . LORazepam (ATIVAN) tablet 1 mg  1 mg Oral Q4H PRN Lavera GuiseLiu, Dana Duo, MD       Or  . LORazepam (ATIVAN) 2 MG/ML concentrated solution 1 mg  1 mg Sublingual  Q4H PRN Lavera Guise, MD       Or  . LORazepam (ATIVAN) injection 1 mg  1 mg Intravenous Q4H PRN Lavera Guise, MD      . LORazepam (ATIVAN) injection 1 mg  1 mg Intravenous Q4H PRN Lavera Guise, MD      . ondansetron (ZOFRAN-ODT) disintegrating tablet 4 mg  4 mg Oral Q6H PRN Lavera Guise, MD       Or  . ondansetron [facility]) injection 4 mg  4 mg Intravenous Q6H PRN Lavera Guise, MD         Discharge Medications: Please see discharge summary for a list of discharge medications.  Relevant Imaging Results:  Relevant Lab Results:   Additional Information SS#[id number]. Hospice to follow at facility  [facility], LCSW

## 2016-12-10 NOTE — Clinical Social Work Note (Signed)
Clinical Social Work Assessment  Patient Details  Name: Heidi George MRN: [id number] Date of Birth: [birth year]  Date of referral:  12/10/16               Reason for consult:  Discharge Planning, End of Life/Hospice                Permission sought to share information with:  Family Supports Permission granted to share information::  No (Pt not responding)  Name::     daughter Heidi George, son Heidi George  Agency::  [facility]  Relationship::     Contact Information:     Housing/Transportation Living arrangements for the past 2 months:  [place] of Information:  Adult Children, Facility Patient Interpreter Needed:  None Criminal Activity/Legal Involvement Pertinent to Current Situation/Hospitalization:  No - Comment as needed Significant Relationships:  Adult Children, Community Support Lives with:  Facility Resident Do you feel safe going back to the place where you live?  Yes Need for family participation in patient care:  Yes (Comment) (pt not responsive, children are decision makers in end-of-life planning)  Care giving concerns:  No caregiving concerns reported   Facilities manager / plan:  CSW consulted to assist in [place]- pt from [facility] and is no full comfort care. Family opts to have pt return to ALF with hospice care.  Met with family at pt's bedside along with CM. CM made referral for [facility] and CSW spoke with [facility] representative to facilitate plan. Will provide FL2 to facility via [place]. Pt will transport via [place] completed medical necessity form and will call for PTAR once facility prepared with needed [facility] DME.  Plan: [facility] with [facility].     Employment status:  Retired Nurse, adult PT Recommendations:  Not assessed at this time Information / Referral to community resources:     Patient/Family's Response to  care:  Appreciative of care  Patient/Family's Understanding of and Emotional Response to Diagnosis, Current Treatment, and Prognosis:  Family shows thorough understanding of plan and care received. Emotionally accepting and appropriate to situation.   Emotional Assessment Appearance:  Appears stated age Attitude/Demeanor/Rapport:   (unresponsive) Affect (typically observed):   (unresponsive) Orientation:    Alcohol / Substance use:  Not Applicable Psych involvement (Current and /or in the community):  No (Comment)  Discharge Needs  Concerns to be addressed:  Discharge Planning Concerns Readmission within the last 30 days:  No Current discharge risk:  None Barriers to Discharge:   (awaiting eqiupment needed for pt's care to ALF)   Nila Nephew, LCSW 12/10/2016, 4:09 PM  [phone]

## 2016-12-10 NOTE — Progress Notes (Signed)
CSW following as pt is admitted from facility- [facility] in [facility] care unit. Per medical team pt is comfort care at this time. Will follow as needs arise.  Ilean SkillMeghan Sophie Tamez, MSW, LCSW Clinical Social Work 12/10/2016 [phone]

## 2016-12-10 NOTE — Care Management Obs Status (Signed)
MEDICARE OBSERVATION STATUS NOTIFICATION   Patient Details  Name: Heidi George MRN: [id number] Date of Birth: 09-Aug-1930   Medicare Observation Status Notification Given:  Yes    MahabirOlegario Messier, Mileigh Tilley, RN 12/10/2016, 2:02 PM

## 2016-12-10 NOTE — Care Management Note (Signed)
Case Management Note  Patient Details  Name: Heidi George MRN: [id number] Date of Birth: 08-15-1930  Subjective/Objective: referral for home hospice services-spoke to son/dtr in rm about home hospice providers-[facility] w[facility] w[facility])-TC Amy rep for [facility]-she will talk to dtr/son in rm today to eval. Informed Amy of dme needed:hospital bed-[facility] prior patient d/c from hospital.. [facility] by Ambulance transp(CSW will manage). Son/dtr voiced understanding.                   Action/Plan:d/c plan [facility] w/[facility]ospice/dme   Expected Discharge Date:   (UNKNOWN)               Expected Discharge Plan:  Assisted Living / Rest Home  In-House Referral:  Clinical Social Work  Discharge planning Services  CM Consult  Post Acute Care Choice:    Choice offered to:  Adult Children  DME Arranged:    DME Agency:     HH Arranged:    HH Agency:     Status of Service:  Completed, signed off  If discussed at Long Length of Stay Meetings, dates discussed:    Additional Comments:  Heidi George, Heidi Fleites, RN 12/10/2016, 1:53 PM

## 2016-12-10 NOTE — Care Management Note (Signed)
Case Management Note  Patient Details  Name: Heidi AugustaSybil Luper MRN: [id number] Date of Birth: December 07, 1930  Subjective/Objective:  [facility] rep Amy has accepted, they will manage hospital bed to arrive @ [facility]. Patient to be d/c once hospital bed arrives to facility.                  Action/Plan:d/c plan ALF w/hospice services/dme   Expected Discharge Date:   (UNKNOWN)               Expected Discharge Plan:  Assisted Living / Rest Home  In-House Referral:  Clinical Social Work  Discharge planning Services  CM Consult  Post Acute Care Choice:    Choice offered to:  Adult Children  DME Arranged:    DME Agency:     HH Arranged:  RN HH Agency:  [facility]  Status of Service:  Completed, signed off  If discussed at Long Length of Stay Meetings, dates discussed:    Additional Comments:  [name], Wyatt Galvan, RN 12/10/2016, 3:46 PM

## 2016-12-10 NOTE — Progress Notes (Signed)
Report called to [facility] and on call Hospice RN notified that the pt was being discharged. Pt cleaned from incontinence and transported via [facility]. Discharge paperwork given to [facility]. Pt appears to be comfortable at this time. No distress noted. Pt's daughter at bedside.

## 2016-12-10 NOTE — Progress Notes (Addendum)
CSW received call from pt's RN, RN received call from [facility] that pt's needed equipment had arrived.  RN requested CSW call [facility].    6:47 PM [facility] called.  Dorothe PeaJonathan F. Alvino Lechuga, Francesco SorLCSWA, LCAS, CSI Clinical Social Worker Ph: [phone]

## 2016-12-10 NOTE — Progress Notes (Signed)
[facility]ia[facility]:  RN  [facility] by Tomma Rakers630pm.   Gail, daughter, is going to facility and wait for equipment.  She will call bedside RN and advise once equipment has been delivered.   RN, please call [phone], when patient is to leave the unit.  Marilynne DriversAdvised Kathy, CMRN of same.   Thank you for the referral,  Adele BarthelAmy Evans, RN, BSN [facility]iaison [phone]

## 2016-12-10 NOTE — Discharge Summary (Addendum)
Physician Discharge Summary  Heidi George ZOX:[id number] DOB: [birth year] DOA: 12/09/2016  PCP: Patient, No Pcp Per  Admit date: 12/09/2016 Discharge date: 12/10/2016  Time spent: Over 35 minutes of which >50% was spent counseling/coordination of care  Recommendations for Outpatient Follow-up:  1. Comfort care and follow up per hospice.    2. Discharging to [facility] with Hospice services.   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  She was brought to the ED for further evaluation and was initially at her baseline mental status, but was noted to have depressed consciousness after her CT scan.  She had Ky Moskowitz CT with bilateral subdural hematomas (larger on the R with mass effect and 10 mm L midline shift), effaced lateral and 3rd ventricles and after discussion with ED and neurosurgery, planned for comfort care.  On day of discharge, arranged hospice services with care management.    Hospital Course:  83 yo with alzheimer's, afib on eliquis, HTN, and DM who presented after fall on eliquis developing bilateral subdural hematomas (larger on R with mass effect, 10 mm L midline shift), effaced lateral and 3rd ventricles and after discussion with ED and neurosurgery, planned for comfort care.  Planned for hosp[facility] services at [facility] after discussion with care management. Given comfort  care, all routine medications have been discontinued.  Procedures:  none   Consultations:  Care management  Discharge Exam: Vitals:   12/10/16 1422 12/10/16 1615  BP:    Pulse:    Resp:    Temp: (!) 103.1 F (39.5 C) (!) 102.7 F (39.3 C)  SpO2:      General: minimal grimace with sternal rub Cardiovascular: RRR, bradycardic Respiratory: Deep respirations Neuro: pupils 3 mm with minimal response (none on L)  Discharge Instructions   Discharge Instructions    Diet regular    Complete by:  As directed      Current Discharge Medication List    STOP taking these medications     apixaban (ELIQUIS) 2.5 MG TABS tablet      calcium carbonate (TUMS - DOSED IN MG ELEMENTAL CALCIUM) 500 MG chewable tablet      Cholecalciferol (VITAMIN D) 2000 units tablet      cloNIDine (CATAPRES) 0.1 MG tablet      donepezil (ARICEPT) 10 MG tablet      ferrous sulfate 325 (65 FE) MG tablet      HYDROcodone-acetaminophen (NORCO/VICODIN) 5-325 MG tablet      levothyroxine (SYNTHROID, LEVOTHROID) 150 MCG tablet      losartan (COZAAR) 100 MG tablet      memantine (NAMENDA XR) 28 MG CP24 24 hr capsule      metFORMIN (GLUCOPHAGE-XR) 500 MG 24 hr tablet      spironolactone (ALDACTONE) 25 MG tablet        Allergies  Allergen Reactions  . Promethazine Other (See Comments)    On MAR     Contact information for follow-up providers    [place] Follow up.   Specialty:  Hospice and Palliative Medicine Why:  home nurse Contact information: [address] [place] [address] [phone]            Contact information for after-discharge care    Destination    [facility] Follow up.   Specialty:  Assisted Living Facility Contact information: [address] [place] [address] [phone]                   The results of significant diagnostics from this hospitalization (including imaging,  microbiology, ancillary and laboratory) are listed below for reference.    Significant Diagnostic Studies: Ct Head Wo Contrast  Result Date: 12/09/2016 CLINICAL DATA:  83 year old female status post trip and fall at assisted living center, striking head. Headache and vomiting. On Eliquis. Hypertensive on presentation, 208 systolic. EXAM: CT HEAD WITHOUT CONTRAST CT CERVICAL SPINE WITHOUT CONTRAST TECHNIQUE: Multidetector CT imaging of the head and cervical spine was performed following the standard protocol without intravenous contrast. Multiplanar CT image reconstructions of the cervical spine were also generated. COMPARISON:  Head and cervical spine CT 06/11/2016. Brain MRI 11/08/2012. FINDINGS: CT HEAD FINDINGS Brain: Bilateral subdural hematomas, that on the right is mostly hyperdense and thicker along the anterior frontal convexity measuring up to 15 mm in thickness. Hemorrhage wraps around the right middle and anterior cranial fossa, up to 7 mm in thickness. Contralateral mixed density left subdural hematoma is smaller, mostly 4-5 mm, but focally up to 7-8 mm in thickness along the left posterior convexity, and at the floor of the left middle cranial fossa. Mass effect on both hemispheres with leftward midline shift of 10 mm. Effaced lateral and third ventricles, but no ventriculomegaly. Gray-white matter differentiation remain stable. No cortically based acute infarct identified. The basilar cisterns remain normal. Vascular: Calcified atherosclerosis at the skull base. Skull: No skull fracture identified. Sinuses/Orbits: Visualized paranasal sinuses and mastoids are stable and well pneumatized. Other: No scalp hematoma identified. Orbits soft tissues appear stable. CT CERVICAL SPINE FINDINGS Alignment: Stable cervical vertebral height and alignment with straightening of lordosis and mild anterolisthesis at C2-C3, C3-C4, and C7-T1. Bilateral posterior element alignment is within normal limits. Skull base  and vertebrae: Visualized skull base is intact. No atlanto-occipital dissociation. No cervical spine fracture identified. Soft tissues and spinal canal: No prevertebral fluid or swelling. No visible canal hematoma. Negative noncontrast neck soft tissues. Disc levels: Widespread chronic cervical disc and endplate degeneration appears stable, with Serapio Edelson degree of chronic degenerative cervical spinal stenosis at C5-C6, and to Mikala Podoll lesser extent C4-C5. Upper chest: Visible upper thoracic levels appear intact. Stable lung apices. IMPRESSION: 1. Bilateral subdural hematomas, larger on the right with mass effect on both cerebral hemispheres and 10 mm of leftward midline shift. 2. Effaced lateral and third ventricles, but no ventriculomegaly. Basilar cisterns remain normal. 3. No associated skull fracture identified. No acute fracture or listhesis in the cervical spine. 4. Critical Value/emergent results were called by telephone at the time of interpretation on 12/09/2016 at 0837 hours to Dr. Crista Curb , who verbally acknowledged these results. Electronically Signed   By: Odessa Fleming M.D.   On: 12/09/2016 08:42   Ct Cervical Spine Wo Contrast  Result Date: 12/09/2016 CLINICAL DATA:  83 year old female status post trip and fall at assisted living  center, striking head. Headache and vomiting. On Eliquis. Hypertensive on presentation, 208 systolic. EXAM: CT HEAD WITHOUT CONTRAST CT CERVICAL SPINE WITHOUT CONTRAST TECHNIQUE: Multidetector CT imaging of the head and cervical spine was performed following the standard protocol without intravenous contrast. Multiplanar CT image reconstructions of the cervical spine were also generated. COMPARISON:  Head and cervical spine CT 06/11/2016. Brain MRI 11/08/2012. FINDINGS: CT HEAD FINDINGS Brain: Bilateral subdural hematomas, that on the right is mostly hyperdense and thicker along the anterior frontal convexity measuring up to 15 mm in thickness. Hemorrhage wraps around the right middle and  anterior cranial fossa, up to 7 mm in thickness. Contralateral mixed density left subdural hematoma is smaller, mostly 4-5 mm, but focally up to 7-8 mm in thickness along the left posterior convexity, and at the floor of the left middle cranial fossa. Mass effect on both hemispheres with leftward midline shift of 10 mm. Effaced lateral and third ventricles, but no ventriculomegaly. Gray-white matter differentiation remain stable. No cortically based acute infarct identified. The basilar cisterns remain normal. Vascular: Calcified atherosclerosis at the skull base. Skull: No skull fracture identified. Sinuses/Orbits: Visualized paranasal sinuses and mastoids are stable and well pneumatized. Other: No scalp hematoma identified. Orbits soft tissues appear stable. CT CERVICAL SPINE FINDINGS Alignment: Stable cervical vertebral height and alignment with straightening of lordosis and mild anterolisthesis at C2-C3, C3-C4, and C7-T1. Bilateral posterior element alignment is within normal limits. Skull base and vertebrae: Visualized skull base is intact. No atlanto-occipital dissociation. No cervical spine fracture identified. Soft tissues and spinal canal: No prevertebral fluid or swelling. No visible canal hematoma. Negative noncontrast neck soft tissues. Disc levels: Widespread chronic cervical disc and endplate degeneration appears stable, with Samaiya Awadallah degree of chronic degenerative cervical spinal stenosis at C5-C6, and to Tameshia Bonneville lesser extent C4-C5. Upper chest: Visible upper thoracic levels appear intact. Stable lung apices. IMPRESSION: 1. Bilateral subdural hematomas, larger on the right with mass effect on both cerebral hemispheres and 10 mm of leftward midline shift. 2. Effaced lateral and third ventricles, but no ventriculomegaly. Basilar cisterns remain normal. 3. No associated skull fracture identified. No acute fracture or listhesis in the cervical spine. 4. Critical Value/emergent results were called by telephone at the  time of interpretation on 12/09/2016 at 0837 hours to Dr. Crista Curb , who verbally acknowledged these results. Electronically Signed   By: Odessa Fleming M.D.   On: 12/09/2016 08:42    Microbiology: No results found for this or any previous visit (from the past 240 hour(s)).   Labs: Basic Metabolic Panel:  Recent Labs Lab 12/09/16 0830  NA 136  K 3.8  CL 101  CO2 22  GLUCOSE 249*  BUN 26*  CREATININE 1.33*  CALCIUM 10.1   Liver Function Tests: No results for input(s): AST, ALT, ALKPHOS, BILITOT, PROT, ALBUMIN in the last 168 hours. No results for input(s): LIPASE, AMYLASE in the last 168 hours. No results for input(s): AMMONIA in the last 168 hours. CBC:  Recent Labs Lab 12/09/16 0830  WBC 20.9*  NEUTROABS 14.9*  HGB 11.4*  HCT 33.7*  MCV 93.9  PLT 280   Cardiac Enzymes: No results for input(s): CKTOTAL, CKMB, CKMBINDEX, TROPONINI in the last 168 hours. BNP: BNP (last 3 results) No results for input(s): BNP in the last 8760 hours.  ProBNP (last 3 results) No results for input(s): PROBNP in the last 8760 hours.  CBG: No results for input(s): GLUCAP in the last 168 hours.     Signed:  Vanice Sarah  Shaune SpittlePowell Jr MD.  [facility] 12/10/2016, 4:37 PM

## 2016-12-10 NOTE — Progress Notes (Signed)
Nutrition Brief Note  Pt screened for low Braden. Chart reviewed. Pt with hx of Alzheimer's, afib, HTN, and DM. She was [facility].  Pt now transitioning to comfort care.  No further nutrition interventions warranted at this time.  Please consult as needed.     Trenton GammonJessica Calvyn Kurtzman, MS, RD, LDN, [facility] Inpatient Clinical Dietitian Pager # [phone] After hours/weekend pager # [phone]

## 2017-01-01 DEATH — deceased

## 2019-01-10 IMAGING — DX DG HUMERUS 2V *R*
2 series · 2 of 2 positions shown · non-contrast
Comparison: Right shoulder series from today reported separately.

CLINICAL DATA: 85-year-old female status post fall with right upper
arm pain and bruising. Initial encounter.

EXAM:
RIGHT HUMERUS - 2+ VIEW

[humerus ap]
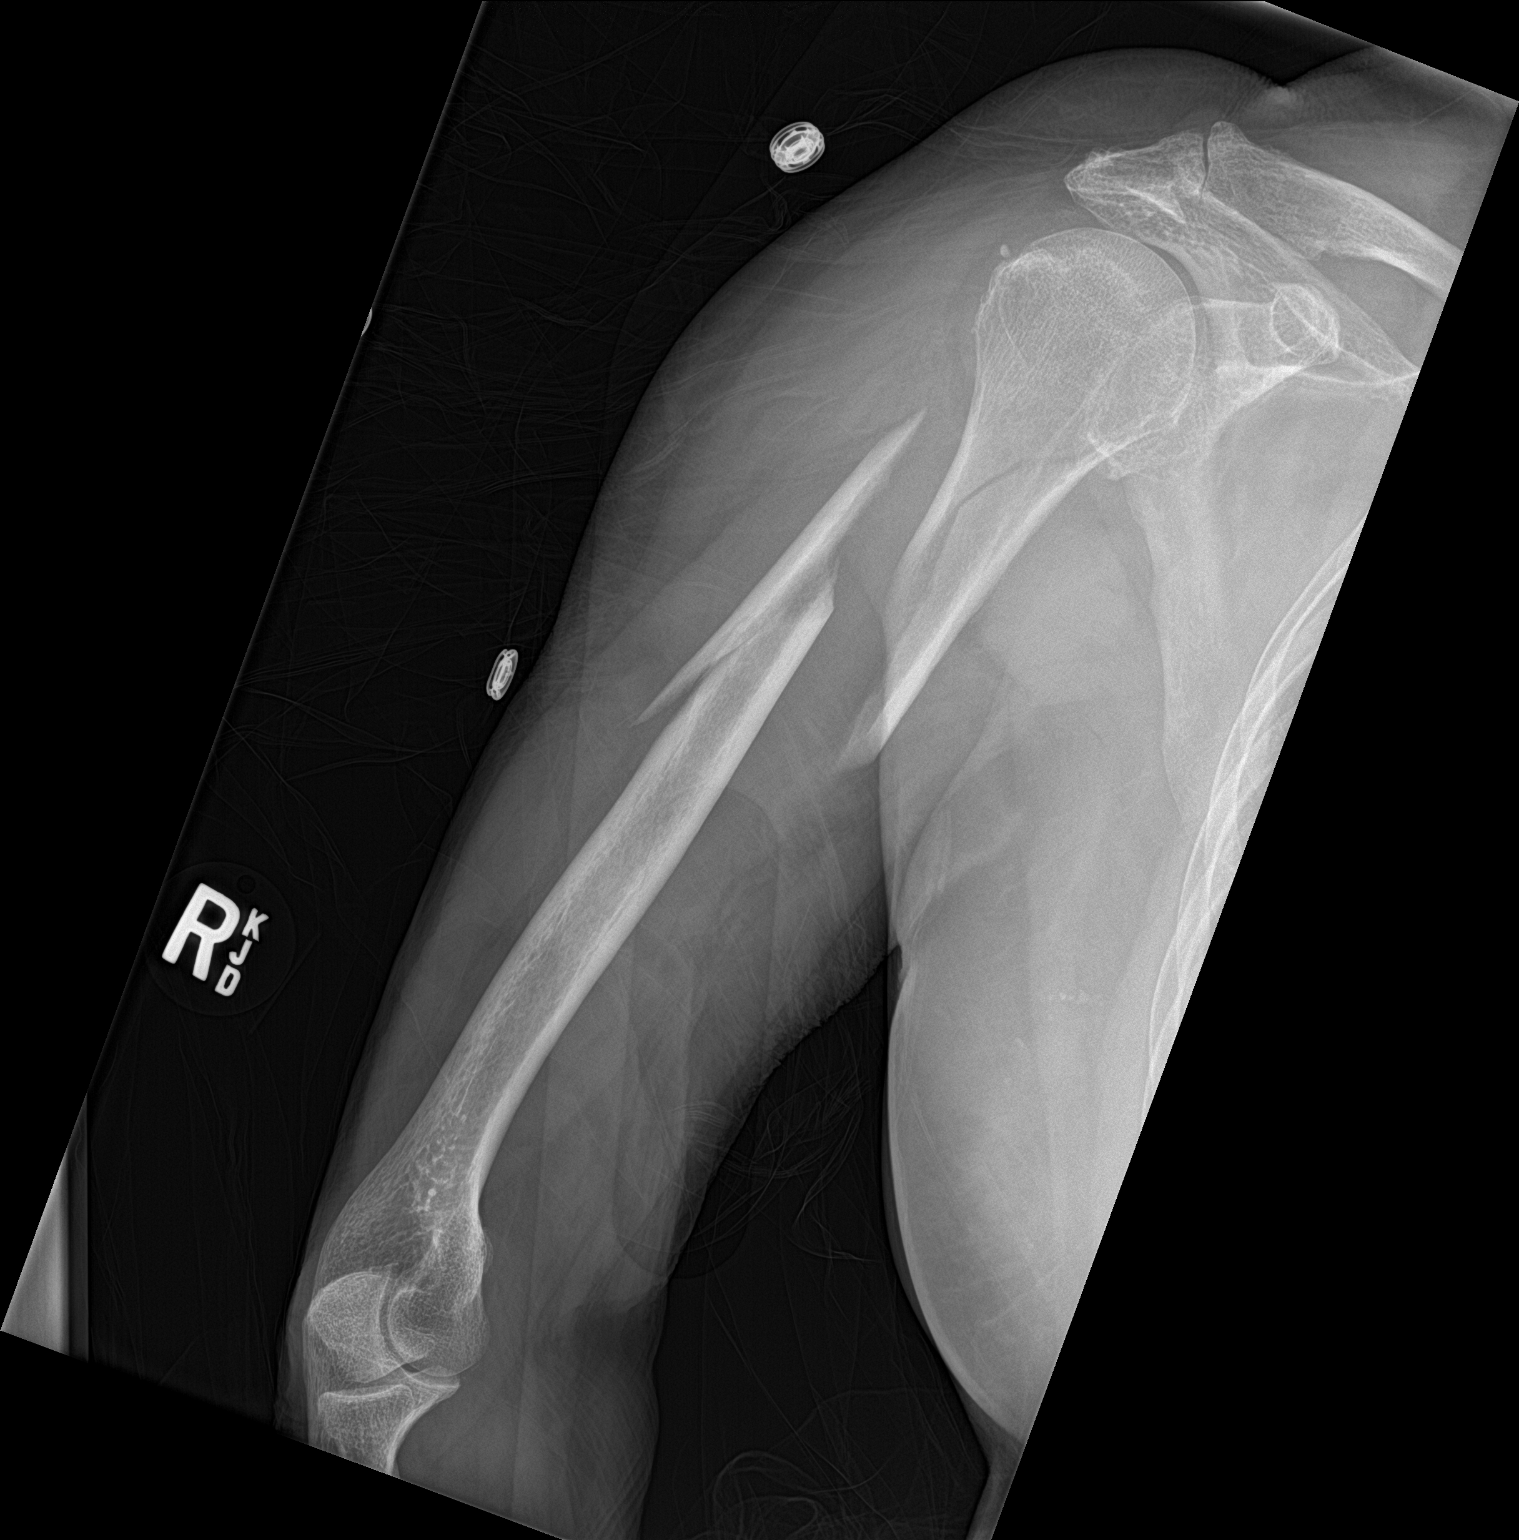

[humerus lat]
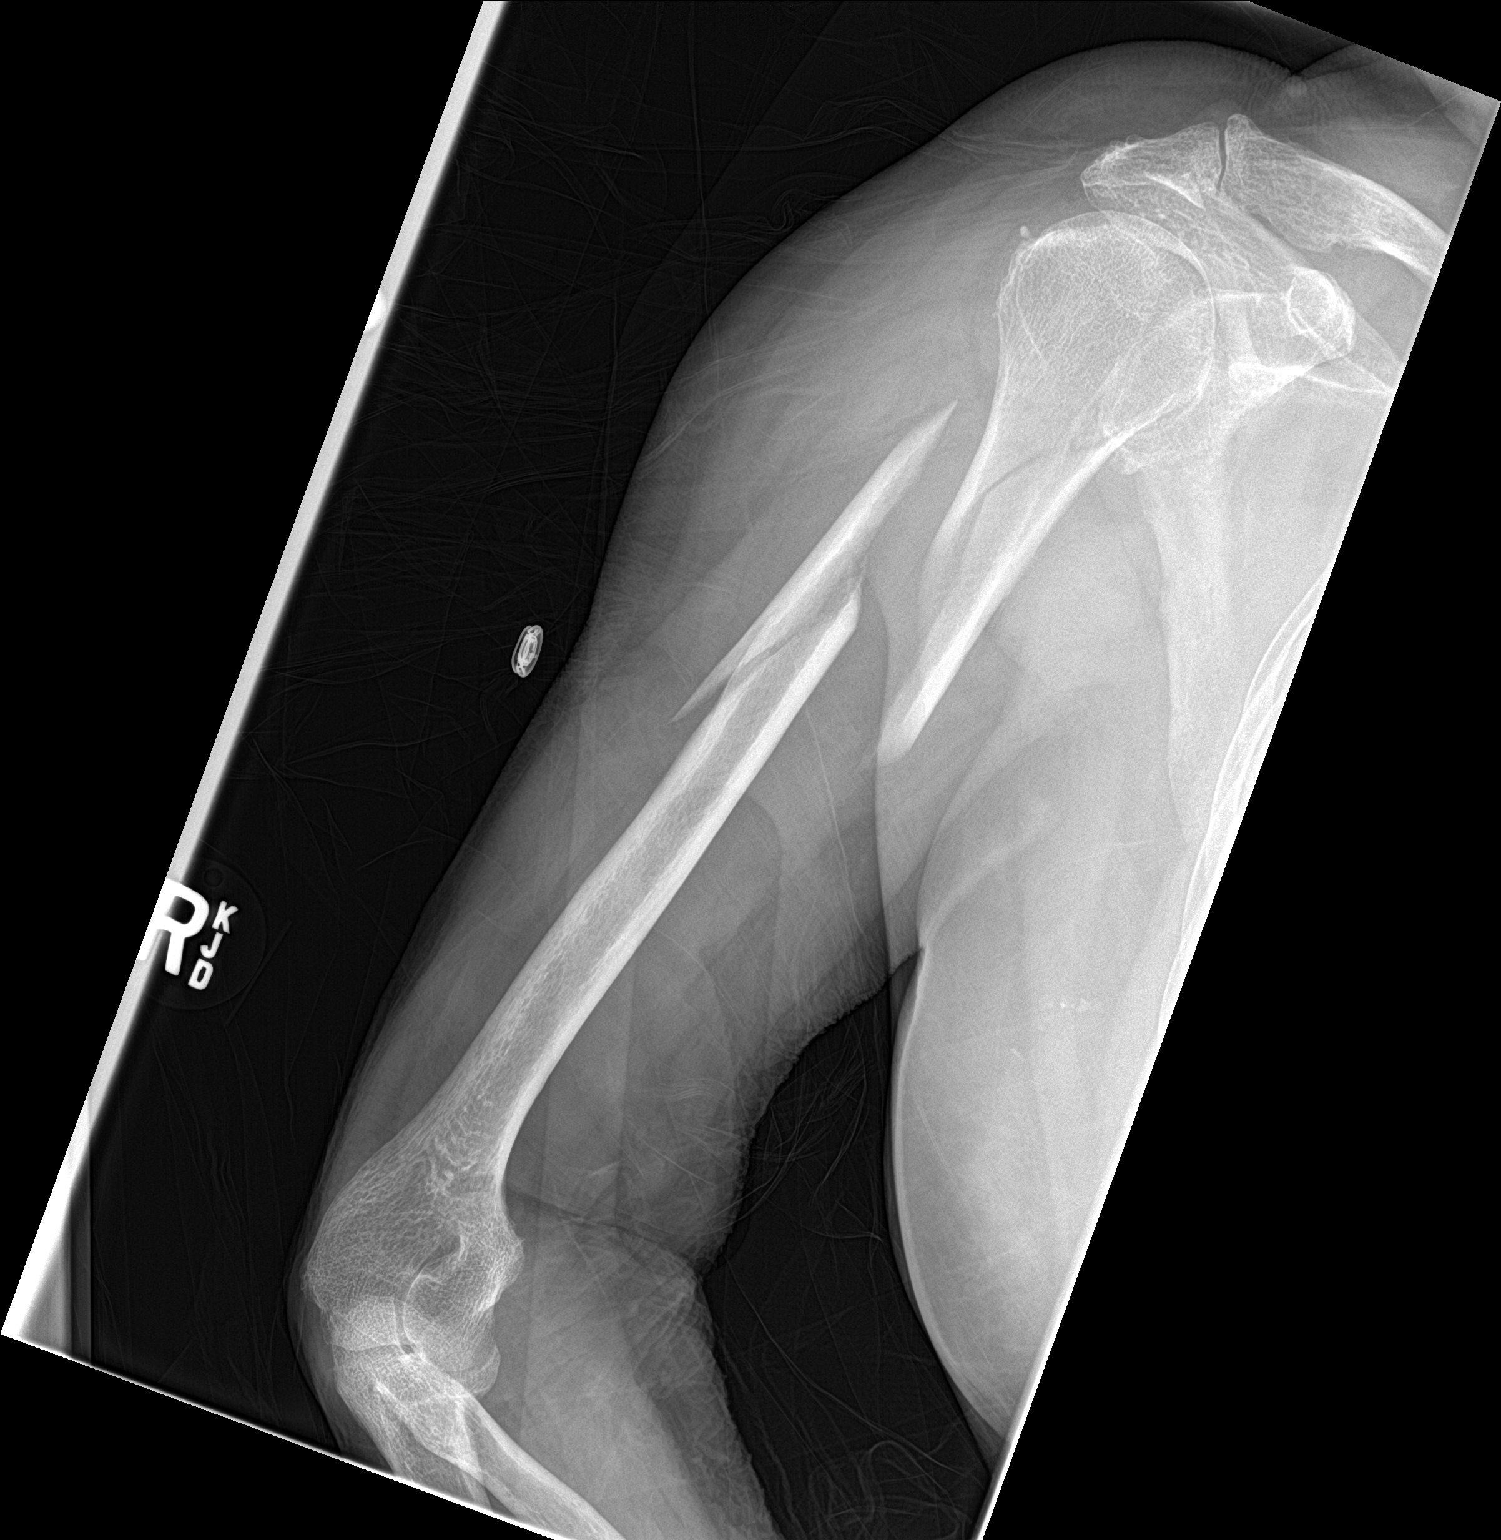

[2 of 2 positions shown; findings below may reference images not displayed]

FINDINGS: Spiral comminuted proximal right humerus fracture re - demonstrated
extending into the proximal third of the right humerus shaft. See
comparison right shoulder series.

The midshaft and more distal right humerus appears intact with
grossly normal alignment at the right elbow.
IMPRESSION: 1. Comminuted proximal right humerus fracture as detailed on the
right shoulder series today.
2. The midshaft and more distal right humerus is intact.

## 2019-01-10 IMAGING — DX DG RIBS W/ CHEST 3+V*L*
4 series · 4 of 4 positions shown · non-contrast
Comparison: Chest radiographs 03/05/2016. Right shoulder series
from today reported separately.

CLINICAL DATA: 85-year-old female status post fall with right upper
arm pain and bruising. Initial encounter.

EXAM:
LEFT RIBS AND CHEST - 3+ VIEW

[chest ap]
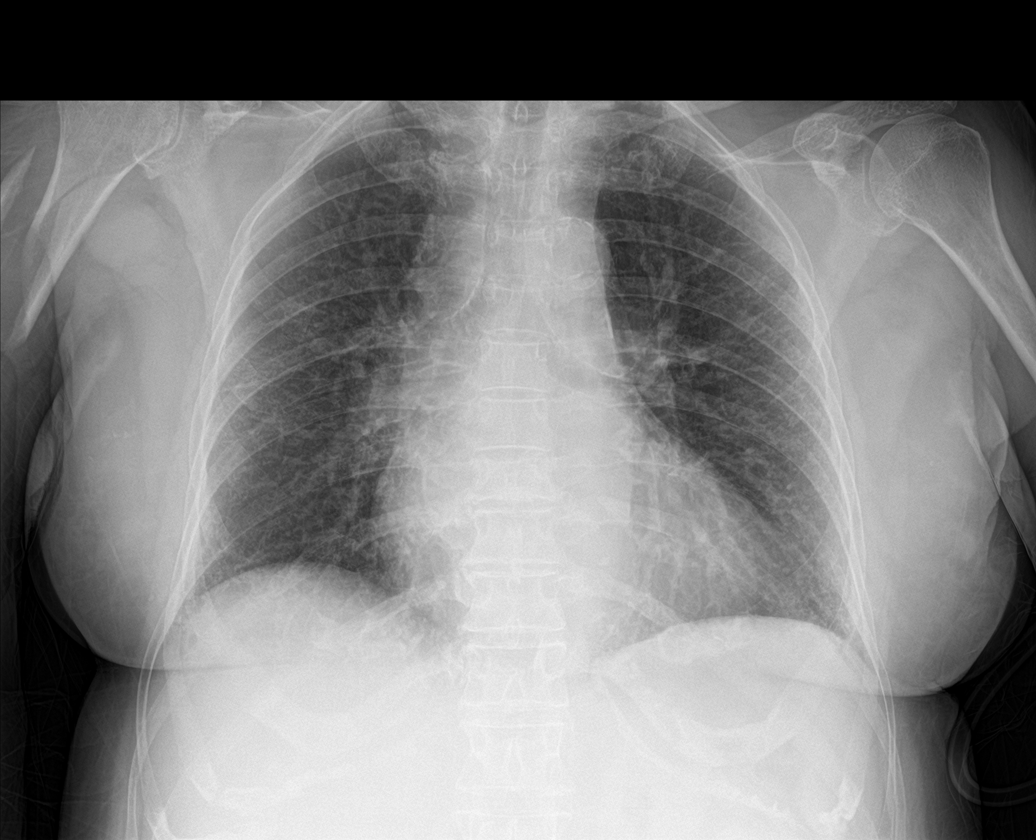

[rib ap (1 of 2)]
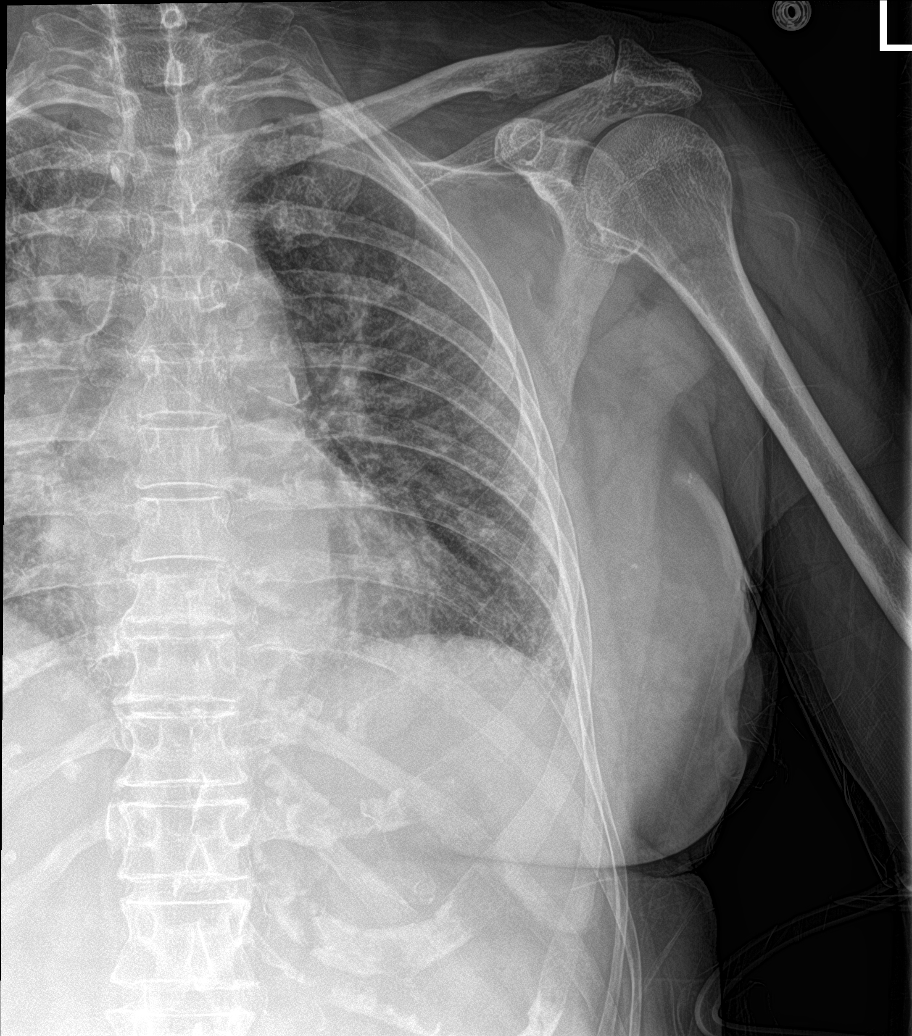

[rib ap obl]
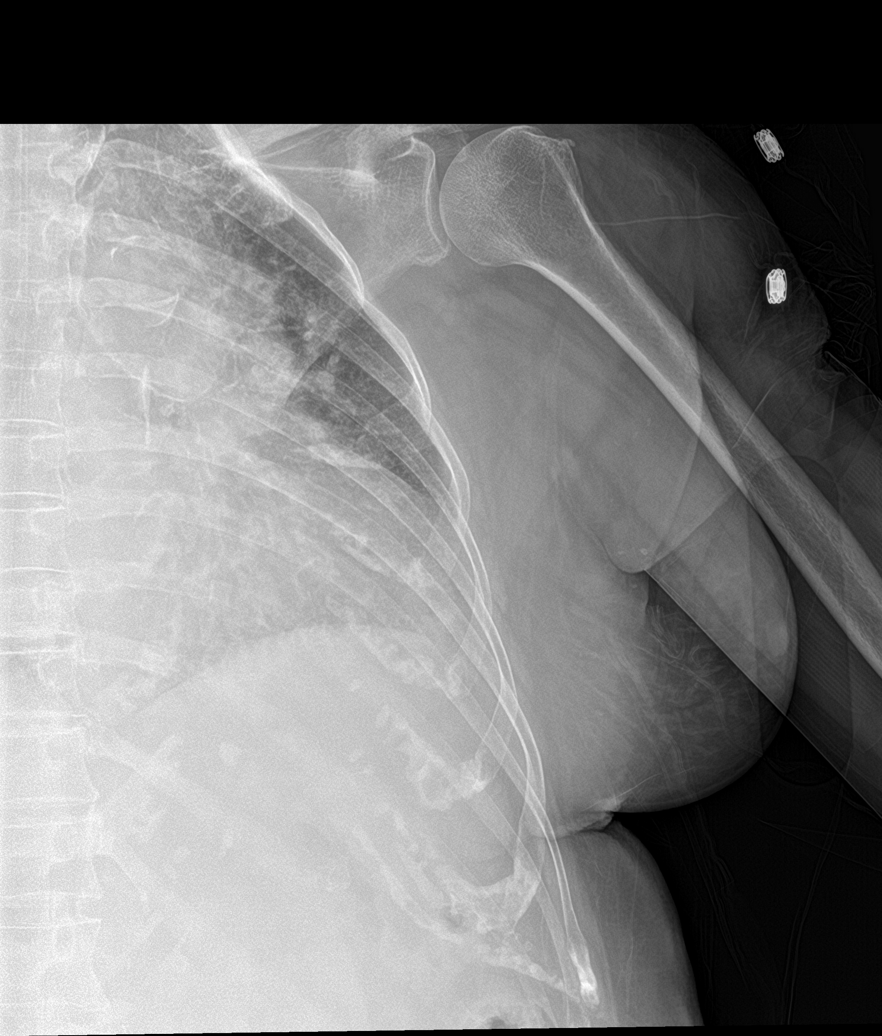

[rib ap (2 of 2)]
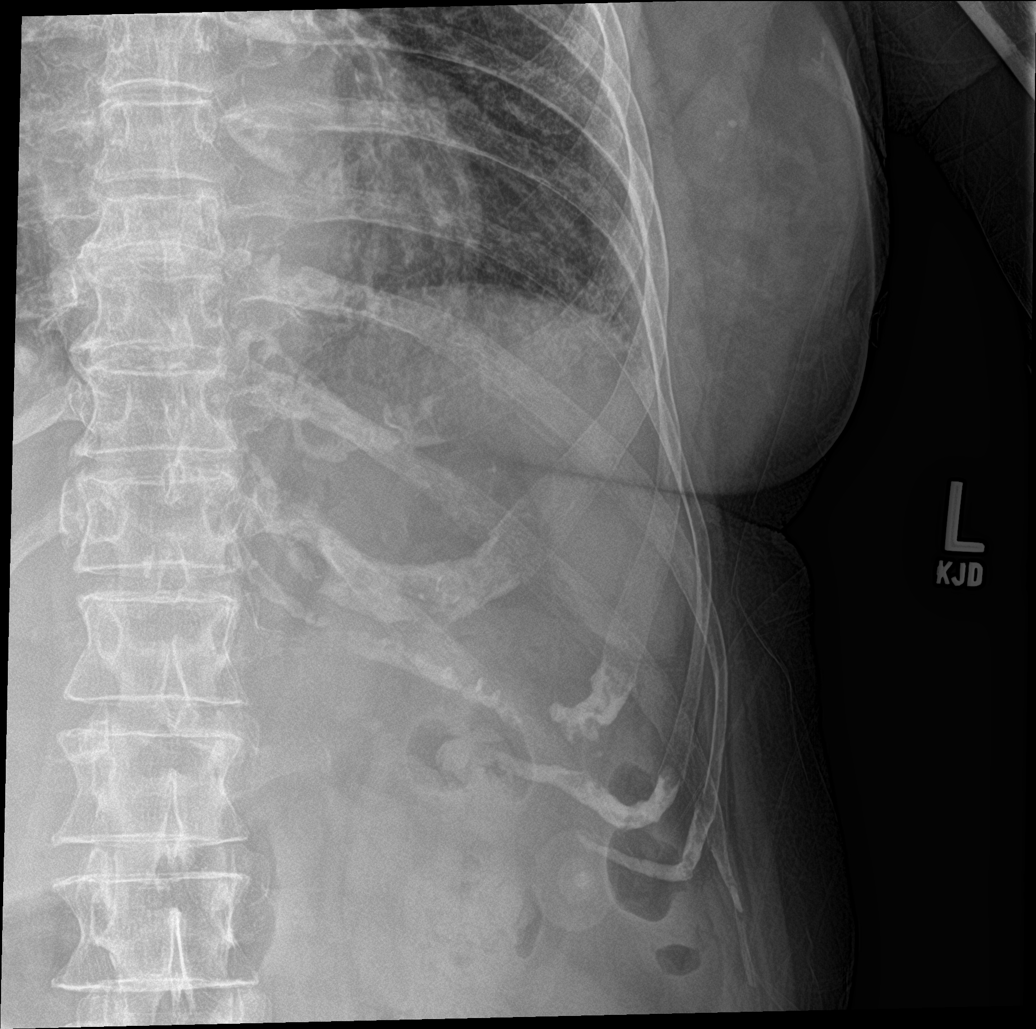

[4 of 4 positions shown; findings below may reference images not displayed]

FINDINGS: Proximal right humerus fracture, see shoulder series.

Improved lung volumes compared to 1298. Stable cardiomegaly and
mediastinal contours. Calcified aortic atherosclerosis. Visualized
tracheal air column is within normal limits. No pneumothorax,
pulmonary edema, pleural effusion or confluent pulmonary opacity.
Mild increased interstitial markings appear chronic and stable.

Osteopenia. Three oblique views of the left ribs are provided. No
displaced left rib fracture identified. However, there is evidence
of a T9 vertebral body compression fracture which is new since
[REDACTED] (arrow).

Other visible osseous structures including those at the left
shoulder appear intact.
IMPRESSION: 1. Proximal right humerus fracture, see shoulder series today.
2. Mild T9 compression fracture suspected and new since March 2016. Query acute back pain.
3.  No displaced left rib fracture identified.
4. No acute cardiopulmonary abnormality. Stable cardiomegaly.
Calcified aortic atherosclerosis.

## 2021-01-01 DEATH — deceased
